# Patient Record
Sex: Male | Born: 1975 | Race: White | Hispanic: No | Marital: Married | Smoking: Current every day smoker
Health system: Southern US, Community
[De-identification: ages and names within clinical notes are randomized; demographics above are authoritative.]

## PROBLEM LIST (undated history)

## (undated) DIAGNOSIS — E781 Pure hyperglyceridemia: Secondary | ICD-10-CM

## (undated) DIAGNOSIS — K2970 Gastritis, unspecified, without bleeding: Secondary | ICD-10-CM

## (undated) HISTORY — DX: Pure hyperglyceridemia: E78.1

## (undated) HISTORY — DX: Gastritis, unspecified, without bleeding: K29.70

## (undated) HISTORY — PX: TONSILLECTOMY: SUR1361

---

## 2011-08-07 HISTORY — PX: OTHER SURGICAL HISTORY: SHX169

## 2012-07-07 ENCOUNTER — Encounter: Payer: Self-pay | Admitting: *Deleted

## 2012-07-07 ENCOUNTER — Emergency Department
Admission: EM | Admit: 2012-07-07 | Discharge: 2012-07-07 | Disposition: A | Discharge status: Home / Self Care | Payer: Managed Care, Other (non HMO) | Source: Home / Self Care | Attending: Family Medicine | Admitting: Family Medicine

## 2012-07-07 DIAGNOSIS — R52 Pain, unspecified: Secondary | ICD-10-CM

## 2012-07-07 DIAGNOSIS — J029 Acute pharyngitis, unspecified: Secondary | ICD-10-CM

## 2012-07-07 DIAGNOSIS — J111 Influenza due to unidentified influenza virus with other respiratory manifestations: Secondary | ICD-10-CM

## 2012-07-07 HISTORY — DX: Pure hyperglyceridemia: E78.1

## 2012-07-07 LAB — POCT RAPID STREP A (OFFICE): Rapid Strep A Screen: NEGATIVE

## 2012-07-07 LAB — POCT INFLUENZA A/B
Influenza A, POC: NEGATIVE
Influenza B, POC: NEGATIVE

## 2012-07-07 MED ORDER — BENZONATATE 200 MG PO CAPS: 200.0000 mg | ORAL_CAPSULE | Freq: Every day | ORAL | Status: DC

## 2012-07-07 MED ORDER — OSELTAMIVIR PHOSPHATE 75 MG PO CAPS: 75.0000 mg | ORAL_CAPSULE | Freq: Two times a day (BID) | ORAL | Status: DC

## 2012-07-07 NOTE — ED Provider Notes (Signed)
History     CSN: [id number]  Arrival date & time 07/07/12  1044   First MD Initiated Contact with Patient 07/07/12 1139      Chief Complaint  Patient presents with  . Sore Throat  . Generalized Body Aches      HPI Comments: Patient complains of onset of flu-like symptoms yesterday, with sore throat, cough, diffuse myalgias, headache, fever/chills/sweats.  Cough is now worse at night and generally non-productive during the day.  There has been no pleuritic pain, shortness of breath, or wheezes.   He has had a flu shot.  He has had pneumonia in the past.  The history is provided by the patient.    Past Medical History  Diagnosis Date  . High triglycerides     Past Surgical History  Procedure Date  . Tonsillectomy     Family History  Problem Relation Age of Onset  . Rheum arthritis Mother   . COPD Father     History  Substance Use Topics  . Smoking status: Current Every Day Smoker  . Smokeless tobacco: Not on file  . Alcohol Use: Yes      Review of Systems + sore throat + cough No pleuritic pain No wheezing + nasal congestion + post-nasal drainage No sinus pain/pressure No itchy/red eyes No earache No hemoptysis No SOB No fever, + chills + nausea No vomiting No abdominal pain No diarrhea No urinary symptoms No skin rashes + fatigue + myalgias + headache Used OTC meds without relief  Allergies  Lyrica; Morphine and related; and Tramadol  Home Medications   Current Outpatient Rx  Name  Route  Sig  Dispense  Refill  . BENZONATATE 200 MG PO CAPS   Oral   Take 1 capsule (200 mg total) by mouth at bedtime. Take as needed for cough   12 capsule   0   . OSELTAMIVIR PHOSPHATE 75 MG PO CAPS   Oral   Take 1 capsule (75 mg total) by mouth every 12 (twelve) hours.   10 capsule   0     BP 109/76  Pulse 77  Temp 98.1 F (36.7 C) (Oral)  Resp 18  Ht 5\' 9"  (1.753 m)  Wt 167 lb (75.751 kg)  BMI 24.66 kg/m2  SpO2 99%  Physical Exam Nursing  notes and Vital Signs reviewed. Appearance:  Patient appears healthy, stated age, and in no acute distress Eyes:  Pupils are equal, round, and reactive to light and accomodation.  Extraocular movement is intact.  Conjunctivae are not inflamed  Ears:  Canals normal.  Tympanic membranes normal.  Nose:  Mildly congested turbinates.  No sinus tenderness.    Pharynx:  Normal Neck:  Supple.  Slightly tender shotty posterior nodes are palpated bilaterally  Lungs:  Clear to auscultation.  Breath sounds are equal.  Chest:  Distinct tenderness to palpation over the mid-sternum.  Heart:  Regular rate and rhythm without murmurs, rubs, or gallops.  Abdomen:  Nontender without masses or hepatosplenomegaly.  Bowel sounds are present.  No CVA or flank tenderness.  Extremities:  No edema.  No calf tenderness Skin:  No rash present.   ED Course  Procedures none   Labs Reviewed  POCT RAPID STREP A (OFFICE) negative  POCT INFLUENZA A/B negative      1. Sore throat   2. Body aches   3. Influenza-like illness       MDM  Although flu test negative, patient's symptoms are definitely flu-like.  Will empirically  begin Tamiflu. Prescription written for Benzonatate [facility]) to take at bedtime for night-time cough.  Take Mucinex D (guaifenesin with decongestant) twice daily for congestion.  Increase fluid intake, rest. May use Afrin nasal spray (or generic oxymetazoline) twice daily for about 5 days.  Also recommend using saline nasal spray several times daily and saline nasal irrigation (AYR is a common brand) Stop all antihistamines for now, and other non-prescription cough/cold preparations. May take Ibuprofen 200mg , 4 tabs every 8 hours with food for chest/sternum discomfort, headache, body aches, etc. Follow-up with family doctor if not improving 7 to 10 days.         Lattie Haw, MD 07/08/12 [phone]

## 2012-07-07 NOTE — ED Notes (Addendum)
Patient c/o sore throat and body aches, chills, sweats x last night. No otc meds taken. Influenza vaccin on October 2013

## 2012-07-11 ENCOUNTER — Ambulatory Visit (INDEPENDENT_AMBULATORY_CARE_PROVIDER_SITE_OTHER): Payer: Managed Care, Other (non HMO) | Admitting: Family Medicine

## 2012-07-11 ENCOUNTER — Encounter: Payer: Self-pay | Admitting: Family Medicine

## 2012-07-11 VITALS — BP 121/85 | HR 89 | Temp 97.5°F | Ht 68.0 in | Wt 168.0 lb

## 2012-07-11 DIAGNOSIS — E781 Pure hyperglyceridemia: Secondary | ICD-10-CM | POA: Insufficient documentation

## 2012-07-11 DIAGNOSIS — J189 Pneumonia, unspecified organism: Secondary | ICD-10-CM

## 2012-07-11 HISTORY — DX: Pure hyperglyceridemia: E78.1

## 2012-07-11 MED ORDER — AZITHROMYCIN 250 MG PO TABS: ORAL_TABLET | ORAL | Status: AC

## 2012-07-11 MED ORDER — HYDROCODONE-HOMATROPINE 5-1.5 MG/5ML PO SYRP: 5.0000 mL | ORAL_SOLUTION | Freq: Three times a day (TID) | ORAL | Status: DC | PRN

## 2012-07-11 NOTE — Progress Notes (Signed)
CC: Jose Roth is a 36 y.o. male is here for Establish Care and flu sxs   Subjective: HPI:  Patient presents due to concern of illness. He was diagnosed with seasonal influenza or parainfluenza on Monday of this week, started Tamiflu 24 hours after symptoms began. He states that his sore throat is resolved however a mildly productive cough, diffuse muscle aches, fatigue, subjective fevers continue. Symptoms are present 24 hours a day, cough seems to interfere with sleep, interventions have included Mucinex and increase fluid intake. Chart review shows negative strep screen and influenza screen. Patient has no past medical history with respect to immune issues nor pulmonary disease. He is no longer smoking.  He denies confusion, motor or sensory disturbances, focal joint or muscle pain, darkened urine, blood and sputum, abdominal pain, nausea, vomiting, rashes. Denies shortness of breath or back pain  He has a history of hypertriglyceridemia, he's unsure when history please refer checked last however he is sure they were well over 500, he has been on medications for this in the past but insurance issues caused him to cease taking whatever medication he was on. He denies a history of pancreatitis, heart disease, exertional chest pain, shortness of breath, limb claudication, nor foot rest pain   Review Of Systems Outlined In HPI  Past Medical History  Diagnosis Date  . High triglycerides   . Hypertriglyceridemia 07/11/2012     Family History  Problem Relation Age of Onset  . Rheum arthritis Mother   . COPD Father      History  Substance Use Topics  . Smoking status: Current Every Day Smoker -- 0.5 packs/day for 16 years    Types: Cigarettes  . Smokeless tobacco: Not on file  . Alcohol Use: Yes     Objective: Filed Vitals:   07/11/12 0946  BP: 121/85  Pulse: 89  Temp: 97.5 F (36.4 C)    General: Alert and Oriented, No Acute Distress HEENT: Pupils equal, round, reactive to  light. Conjunctivae clear.  External ears unremarkable, canals clear with intact TMs with appropriate landmarks.  Middle ear appears open without effusion. Pink inferior turbinates.  Moist mucous membranes, pharynx without inflammation nor lesions.  Neck supple without palpable lymphadenopathy nor abnormal masses. Lungs: Comfortable work of breathing, good air movement, mild  rails in the left lower posterior lung field, no wheezing nor rhonchi  Cardiac: Regular rate and rhythm. Normal S1/S2.  No murmurs, rubs, nor gallops.   Extremities: No peripheral edema.  Strong peripheral pulses.  Mental Status: No depression, anxiety, nor agitation. Skin: Warm and dry.  Assessment & Plan: Jose Roth was seen today for establish care and flu sxs.  Diagnoses and associated orders for this visit:  Pneumonia - azithromycin (ZITHROMAX) 250 MG tablet; Take two tabs at once on day 1, then one tab daily on days 2-5. - HYDROcodone-homatropine (HYCODAN) 5-1.5 MG/5ML syrup; Take 5 mLs by mouth every 8 (eight) hours as needed for cough.  Hypertriglyceridemia - Lipid panel    Concerned that he may have walking pneumonia, I would like him to start azithromycin, and use Hycodan to help cut down his cough to help him sleep better.  I've asked him to start acetaminophen to help with the muscle aches, apparently he is intolerant of nonsteroidal anti-inflammatories. He was given a lab ticket for a lipid panel however he prefers to have this done in a few weeks once his sickness improves.Signs and symptoms requring emergent/urgent reevaluation were discussed with the patient.  Return in  about 4 weeks (around 08/08/2012).

## 2012-07-12 ENCOUNTER — Telehealth: Payer: Self-pay | Admitting: Emergency Medicine

## 2012-08-11 ENCOUNTER — Encounter (HOSPITAL_BASED_OUTPATIENT_CLINIC_OR_DEPARTMENT_OTHER): Payer: Self-pay

## 2012-08-11 ENCOUNTER — Ambulatory Visit (INDEPENDENT_AMBULATORY_CARE_PROVIDER_SITE_OTHER): Payer: Managed Care, Other (non HMO) | Admitting: Family Medicine

## 2012-08-11 ENCOUNTER — Encounter: Payer: Self-pay | Admitting: Family Medicine

## 2012-08-11 ENCOUNTER — Ambulatory Visit (HOSPITAL_BASED_OUTPATIENT_CLINIC_OR_DEPARTMENT_OTHER)
Admission: RE | Admit: 2012-08-11 | Discharge: 2012-08-11 | Disposition: A | Discharge status: Home / Self Care | Payer: Managed Care, Other (non HMO) | Source: Ambulatory Visit | Attending: Family Medicine | Admitting: Family Medicine

## 2012-08-11 VITALS — BP 136/86 | HR 112 | Temp 97.6°F | Wt 175.0 lb

## 2012-08-11 DIAGNOSIS — R110 Nausea: Secondary | ICD-10-CM

## 2012-08-11 DIAGNOSIS — R109 Unspecified abdominal pain: Secondary | ICD-10-CM

## 2012-08-11 DIAGNOSIS — K7689 Other specified diseases of liver: Secondary | ICD-10-CM | POA: Insufficient documentation

## 2012-08-11 DIAGNOSIS — N281 Cyst of kidney, acquired: Secondary | ICD-10-CM | POA: Insufficient documentation

## 2012-08-11 DIAGNOSIS — N410 Acute prostatitis: Secondary | ICD-10-CM

## 2012-08-11 DIAGNOSIS — R300 Dysuria: Secondary | ICD-10-CM

## 2012-08-11 DIAGNOSIS — R1031 Right lower quadrant pain: Secondary | ICD-10-CM | POA: Insufficient documentation

## 2012-08-11 LAB — POCT URINALYSIS DIPSTICK
Bilirubin, UA: NEGATIVE
Glucose, UA: NEGATIVE
Ketones, UA: NEGATIVE
Leukocytes, UA: NEGATIVE
Nitrite, UA: NEGATIVE

## 2012-08-11 MED ORDER — IOHEXOL 300 MG/ML  SOLN
100.0000 mL | Freq: Once | INTRAMUSCULAR | Status: AC | PRN
  Administered 2012-08-11: 100 mL via INTRAVENOUS

## 2012-08-11 MED ORDER — PROMETHAZINE HCL 50 MG PO TABS: 50.0000 mg | ORAL_TABLET | Freq: Four times a day (QID) | ORAL | Status: DC | PRN

## 2012-08-11 MED ORDER — HYDROCODONE-ACETAMINOPHEN 5-325 MG PO TABS: 1.0000 | ORAL_TABLET | ORAL | Status: DC | PRN

## 2012-08-11 MED ORDER — CIPROFLOXACIN HCL 500 MG PO TABS: 500.0000 mg | ORAL_TABLET | Freq: Two times a day (BID) | ORAL | Status: DC

## 2012-08-11 NOTE — Progress Notes (Signed)
CC: Jose Roth is a 37 y.o. male is here for Abdominal Pain, left side pain and Dysuria   Subjective: HPI:  Patient complains of one week of lower abdominal pain is described as a burning that radiates to his lower back and pelvis. Nothing makes this better or worse. He does not appear to be related to bowel movements which she reports are current 4 times a day and are somewhat loose. He is unsure whether or not this related to a urinary habits but does admit to urinary frequency and hesitancy which she has never had before and a sensation of discomfort at the base of the penis when urinating. This is been accompanied by nausea and vomiting which prompted a emergency room visit one week ago at [facility] where he received fluids and had a CT scan of the abdomen and blood work all of which per his report were normal. Symptoms were severe at the time of ED visit however have remained at moderate severity since. He reports no improvement over the past week. Nausea controlled with promethazine, nothing else seems to make the above symptoms better or worse. Symptoms are present 24 hours a day. Denies confusion, motor sensory disturbances, chest pain, shortness of breath, cough, flank pain, blood in stool, tarry or stool, discharge from penis nor testicular pain.  Review Of Systems Outlined In HPI  Past Medical History  Diagnosis Date  . High triglycerides   . Hypertriglyceridemia 07/11/2012     Family History  Problem Relation Age of Onset  . Rheum arthritis Mother   . COPD Father      History  Substance Use Topics  . Smoking status: Current Every Day Smoker -- 0.5 packs/day for 16 years    Types: Cigarettes  . Smokeless tobacco: Not on file  . Alcohol Use: Yes     Objective: Filed Vitals:   08/11/12 1549  BP: 136/86  Pulse: 112  Temp: 97.6 F (36.4 C)    General: Alert and Oriented, No Acute Distress HEENT: Pupils equal, round, reactive to light. Conjunctivae  clear.  External ears unremarkable, canals clear with intact TMs with appropriate landmarks.  Middle ear appears open without effusion. Pink inferior turbinates.  Moist mucous membranes, pharynx without inflammation nor lesions.  Neck supple without palpable lymphadenopathy nor abnormal masses. Lungs: Clear to auscultation bilaterally, no wheezing/ronchi/rales.  Comfortable work of breathing. Good air movement. Cardiac:  Mild tachycardia . Normal S1/S2.  No murmurs, rubs, nor gallops.   Abdomen:  Diminished bowel sounds, soft however right lower quadrant tenderness with rebound present. Left lower quadrant tenderness without rebound. Suprapubic tenderness with rebound. Psoas sign on the right is positive. Heel strike test reproduces pain Extremities: No peripheral edema.  Strong peripheral pulses.  Mental Status: No depression, anxiety, nor agitation. Skin: Warm and dry.  Assessment & Plan: Jose Roth was seen today for abdominal pain, left side pain and dysuria.  Diagnoses and associated orders for this visit:  Dysuria - Urinalysis Dipstick - Urine culture - GC/chlamydia probe amp, urine  Abdominal pain - CT Abdomen Pelvis W Contrast; Future - HYDROcodone-acetaminophen (NORCO) 5-325 MG per tablet; Take 1 tablet by mouth every 4 (four) hours as needed for pain.  Rlq abdominal pain - CT Abdomen Pelvis W Contrast; Future  Nausea - promethazine (PHENERGAN) 50 MG tablet; Take 1 tablet (50 mg total) by mouth every 6 (six) hours as needed for nausea.  Acute prostatitis - ciprofloxacin (CIPRO) 500 MG tablet; Take 1 tablet (500 mg  total) by mouth 2 (two) times daily.    Right lower quadrant pain: Concerning for appendicitis, urgent CT ordered as patient reluctant to go to emergency room.  Dysuria: Small blood in the urine we'll send for culture and GC/Chlamydia.  Patient notified at the following phone number  [phone] that CT scan showed no significant pathology to account for his  nausea, diarrhea, abdominal pain. Suspect acute bacterial prostatitis could be contributes to some of his symptoms therefore compared retreating with Cipro pending studies above. Asked him to contact me if not improving within 48 hours. Signs and symptoms requring emergent/urgent reevaluation were discussed with the patient.  No Follow-up on file.

## 2012-08-12 ENCOUNTER — Encounter: Payer: Self-pay | Admitting: *Deleted

## 2012-08-12 LAB — GC/CHLAMYDIA PROBE AMP, URINE: Chlamydia, Swab/Urine, PCR: NEGATIVE

## 2012-08-13 ENCOUNTER — Telehealth: Payer: Self-pay | Admitting: *Deleted

## 2012-08-13 LAB — URINE CULTURE: Organism ID, Bacteria: NO GROWTH

## 2012-08-13 NOTE — Telephone Encounter (Signed)
Jose Roth, Can you please ask Jose Roth to return tomorrow morning if still not feeling any better, it may take up to 48 hours for the cipro to fully start working.  If not better by tomorrow I'll need to reconsider a treatment plan. Thank you

## 2012-08-13 NOTE — Telephone Encounter (Signed)
Wife calls and states husband is not feeling any better. Still having abdominal pain. Hurts when urinates. Pain is not worse just the same. Temp is 99.6

## 2012-08-13 NOTE — Telephone Encounter (Signed)
Called and spoke to wife and she wanted to go ahead and put him on the schedule for tomorrow

## 2012-08-14 ENCOUNTER — Encounter: Payer: Self-pay | Admitting: Family Medicine

## 2012-08-14 ENCOUNTER — Ambulatory Visit (INDEPENDENT_AMBULATORY_CARE_PROVIDER_SITE_OTHER): Payer: Managed Care, Other (non HMO) | Admitting: Family Medicine

## 2012-08-14 VITALS — BP 115/90 | HR 89 | Temp 97.3°F | Wt 179.0 lb

## 2012-08-14 DIAGNOSIS — R197 Diarrhea, unspecified: Secondary | ICD-10-CM

## 2012-08-14 DIAGNOSIS — R319 Hematuria, unspecified: Secondary | ICD-10-CM

## 2012-08-14 DIAGNOSIS — R109 Unspecified abdominal pain: Secondary | ICD-10-CM

## 2012-08-14 LAB — CBC WITH DIFFERENTIAL/PLATELET
Basophils Absolute: 0 10*3/uL (ref 0.0–0.1)
Eosinophils Absolute: 0.3 10*3/uL (ref 0.0–0.7)
Eosinophils Relative: 3 % (ref 0–5)
MCH: 30.3 pg (ref 26.0–34.0)
MCV: 86 fL (ref 78.0–100.0)
Platelets: 224 10*3/uL (ref 150–400)
RDW: 14.4 % (ref 11.5–15.5)
WBC: 9.7 10*3/uL (ref 4.0–10.5)

## 2012-08-14 LAB — COMPLETE METABOLIC PANEL WITH GFR
AST: 65 U/L — ABNORMAL HIGH (ref 0–37)
Alkaline Phosphatase: 55 U/L (ref 39–117)
BUN: 10 mg/dL (ref 6–23)
Creat: 0.91 mg/dL (ref 0.50–1.35)

## 2012-08-14 LAB — POCT URINALYSIS DIPSTICK
Bilirubin, UA: NEGATIVE
Glucose, UA: NEGATIVE
Leukocytes, UA: NEGATIVE
Nitrite, UA: NEGATIVE
Urobilinogen, UA: 0.2
pH, UA: 6

## 2012-08-14 MED ORDER — HYOSCYAMINE SULFATE 0.125 MG PO TABS: 0.1250 mg | ORAL_TABLET | ORAL | Status: DC | PRN

## 2012-08-14 MED ORDER — METRONIDAZOLE 500 MG PO TABS: 500.0000 mg | ORAL_TABLET | Freq: Three times a day (TID) | ORAL | Status: AC

## 2012-08-14 NOTE — Progress Notes (Signed)
CC: Jose Roth is a 37 y.o. male is here for Abdominal Pain and Dysuria   Subjective: HPI:  Jose Roth presents for followup for abdominal pain, dysuria, loose stools. This is been going on for a little over a week now. Symptoms have been present on a daily basis, 24 hours a day, patient believes he may be feeling somewhat better from a abdominal pain standpoint but overall he still feels "poorly". Abdominal pain as described as a cramping in the lower abdomen which turns into a burning only when urinating. There is no pain on urination elsewhere. At his last visit the abdominal pain radiated to his back which is no longer present. He is still having 2-3 loose bowel movements a day. Cramping of the abdomen improves with curling forward, worse when lying flat. Overall symptoms are described as moderate to severe in intensity. Promethazine is helping with nausea however only temporarily. He denies vomiting since I saw him last, nor recent or remote are like or blood in the stool. He denies sick contacts. He believes he probably had the flu right before this all started. He denies confusion, shortness of breath, chest pain, cough, wheezing, back pain, gross hematuria, flank pain, fevers, chills, tachycardia , dizziness, muscle or joint pain   Review Of Systems Outlined In HPI  Past Medical History  Diagnosis Date  . High triglycerides   . Hypertriglyceridemia 07/11/2012     Family History  Problem Relation Age of Onset  . Rheum arthritis Mother   . COPD Father      History  Substance Use Topics  . Smoking status: Current Every Day Smoker -- 0.5 packs/day for 16 years    Types: Cigarettes  . Smokeless tobacco: Not on file  . Alcohol Use: Yes     Objective: Filed Vitals:   08/14/12 1000  BP: 115/90  Pulse:   Temp:     General: Alert and Oriented, No Acute Distress HEENT: Pupils equal, round, reactive to light. Conjunctivae clear.  External ears unremarkable, canals clear with  intact TMs with appropriate landmarks.  Middle ear appears open without effusion. Pink inferior turbinates.  Moist mucous membranes, pharynx without inflammation nor lesions.  Neck supple without palpable lymphadenopathy nor abnormal masses. Lungs: Clear to auscultation bilaterally, no wheezing/ronchi/rales.  Comfortable work of breathing. Good air movement. Cardiac: Regular rate and rhythm. Normal S1/S2.  No murmurs, rubs, nor gallops.   Abdomen: Hyperactive bowel sounds, no right upper quadrant pain, no epigastric pain, no palpable hepatosplenomegaly. There is suprapubic and left lower quadrant pain to deep palpation but no rebound. Heel strike mildly reproduces pain.  Extremities: No peripheral edema.  Strong peripheral pulses.  Mental Status: No depression, anxiety, nor agitation. Skin: Warm and dry.  Assessment & Plan: Chancy was seen today for abdominal pain and dysuria.  Diagnoses and associated orders for this visit:  Hematuria - Urinalysis, Routine w reflex microscopic - Urinalysis Dipstick  Abdominal pain - hyoscyamine (LEVSIN, ANASPAZ) 0.125 MG tablet; Take 1 tablet (0.125 mg total) by mouth every 4 (four) hours as needed for cramping. - metroNIDAZOLE (FLAGYL) 500 MG tablet; Take 1 tablet (500 mg total) by mouth 3 (three) times daily. - Lactic acid, plasma - CBC w/Diff - Lipase - COMPLETE METABOLIC PANEL WITH GFR  Diarrhea - CBC w/Diff - Clostridium difficile toxin - Stool Culture    Hematuria: Sending for microscopy to determine RBCs per high-power field .GC and chlamydia as well as urine culture were negative last visit, I like him to continue  Cipro for mild suspicion of prostatitis   Abdominal pain, Levsin to aid in discomfort from spasms. I like to start Flagyl until labs come back in case of infectious colitis. Lactic acid to rule out ischemic colitis. CBC primarily for white count. Lipase and liver enzymes to rule out upper abdominal pathology. Diarrhea: Stool  studies above to rule out infectious etiology Signs and symptoms requring emergent/urgent reevaluation were discussed with the patient. I will call him with labs begin to return  Return in about 4 days (around 08/18/2012).

## 2012-08-15 ENCOUNTER — Encounter: Payer: Self-pay | Admitting: Family Medicine

## 2012-08-15 ENCOUNTER — Telehealth: Payer: Self-pay | Admitting: *Deleted

## 2012-08-15 DIAGNOSIS — R748 Abnormal levels of other serum enzymes: Secondary | ICD-10-CM | POA: Insufficient documentation

## 2012-08-15 LAB — URINALYSIS, ROUTINE W REFLEX MICROSCOPIC
Glucose, UA: NEGATIVE mg/dL
Leukocytes, UA: NEGATIVE
Protein, ur: NEGATIVE mg/dL
pH: 6 (ref 5.0–8.0)

## 2012-08-15 LAB — LACTIC ACID, PLASMA: LACTIC ACID: 1 mmol/L (ref 0.5–2.2)

## 2012-08-15 LAB — LIPID PANEL: Triglycerides: 455 mg/dL — ABNORMAL HIGH (ref <150)

## 2012-08-15 NOTE — Telephone Encounter (Signed)
letter

## 2012-08-17 ENCOUNTER — Emergency Department (HOSPITAL_BASED_OUTPATIENT_CLINIC_OR_DEPARTMENT_OTHER)
Admission: EM | Admit: 2012-08-17 | Discharge: 2012-08-18 | Disposition: A | Discharge status: Home / Self Care | Payer: Managed Care, Other (non HMO) | Attending: Emergency Medicine | Admitting: Emergency Medicine

## 2012-08-17 ENCOUNTER — Encounter (HOSPITAL_BASED_OUTPATIENT_CLINIC_OR_DEPARTMENT_OTHER): Payer: Self-pay | Admitting: *Deleted

## 2012-08-17 DIAGNOSIS — F17200 Nicotine dependence, unspecified, uncomplicated: Secondary | ICD-10-CM | POA: Insufficient documentation

## 2012-08-17 DIAGNOSIS — R109 Unspecified abdominal pain: Secondary | ICD-10-CM | POA: Insufficient documentation

## 2012-08-17 DIAGNOSIS — Z8639 Personal history of other endocrine, nutritional and metabolic disease: Secondary | ICD-10-CM | POA: Insufficient documentation

## 2012-08-17 DIAGNOSIS — Z862 Personal history of diseases of the blood and blood-forming organs and certain disorders involving the immune mechanism: Secondary | ICD-10-CM | POA: Insufficient documentation

## 2012-08-17 DIAGNOSIS — Z79899 Other long term (current) drug therapy: Secondary | ICD-10-CM | POA: Insufficient documentation

## 2012-08-17 LAB — URINALYSIS, ROUTINE W REFLEX MICROSCOPIC
Bilirubin Urine: NEGATIVE
Glucose, UA: NEGATIVE mg/dL
Ketones, ur: NEGATIVE mg/dL
Leukocytes, UA: NEGATIVE
Nitrite: NEGATIVE
Protein, ur: NEGATIVE mg/dL
Specific Gravity, Urine: 1.006 (ref 1.005–1.030)
Urobilinogen, UA: 0.2 mg/dL (ref 0.0–1.0)
pH: 6.5 (ref 5.0–8.0)

## 2012-08-17 LAB — CBC WITH DIFFERENTIAL/PLATELET
Basophils Absolute: 0 10*3/uL (ref 0.0–0.1)
Basophils Relative: 0 % (ref 0–1)
Eosinophils Absolute: 0.4 10*3/uL (ref 0.0–0.7)
HCT: 45.1 % (ref 39.0–52.0)
Lymphs Abs: 3.4 10*3/uL (ref 0.7–4.0)
MCH: 30.3 pg (ref 26.0–34.0)
MCHC: 34.8 g/dL (ref 30.0–36.0)
MCV: 86.9 fL (ref 78.0–100.0)
Monocytes Absolute: 1.2 10*3/uL — ABNORMAL HIGH (ref 0.1–1.0)
Neutro Abs: 8.2 10*3/uL — ABNORMAL HIGH (ref 1.7–7.7)
RDW: 13.4 % (ref 11.5–15.5)

## 2012-08-17 LAB — COMPREHENSIVE METABOLIC PANEL
Albumin: 4.1 g/dL (ref 3.5–5.2)
BUN: 11 mg/dL (ref 6–23)
Creatinine, Ser: 1.1 mg/dL (ref 0.50–1.35)
GFR calc Af Amer: >90 mL/min (ref >90)
Glucose, Bld: 97 mg/dL (ref 70–99)
Total Protein: 7.4 g/dL (ref 6.0–8.3)

## 2012-08-17 LAB — URINE MICROSCOPIC-ADD ON

## 2012-08-17 LAB — LIPASE, BLOOD: Lipase: 30 U/L (ref 11–59)

## 2012-08-17 MED ORDER — SODIUM CHLORIDE 0.9 % IV SOLN
Freq: Once | INTRAVENOUS | Status: AC
  Administered 2012-08-17: 23:00:00 via INTRAVENOUS

## 2012-08-17 MED ORDER — HYDROMORPHONE HCL PF 1 MG/ML IJ SOLN
1.0000 mg | Freq: Once | INTRAMUSCULAR | Status: AC
  Administered 2012-08-17: 1 mg via INTRAVENOUS
  Filled 2012-08-17: qty 1

## 2012-08-17 MED ORDER — ONDANSETRON HCL 4 MG/2ML IJ SOLN
4.0000 mg | Freq: Once | INTRAMUSCULAR | Status: AC
  Administered 2012-08-17: 4 mg via INTRAVENOUS
  Filled 2012-08-17: qty 2

## 2012-08-17 NOTE — ED Notes (Addendum)
C/o abd pain since Jan 1st. Describes as constant and squeezing type pain. Also describes as a burning type pain. States pain is mid abdominal.  Has been seen by his MD recently and had a negative CT scan done. Has been treated with antibiotics. C/o nausea. Denies vomiting and diarrhea. Has been treated for "bladder infection" as well

## 2012-08-17 NOTE — ED Provider Notes (Signed)
History     CSN: [id number]  Arrival date & time 08/17/12  1936   First MD Initiated Contact with Patient 08/17/12 2158      Chief Complaint  Patient presents with  . Abdominal Pain    (Consider location/radiation/quality/duration/timing/severity/associated sxs/prior treatment) Patient is a 37 y.o. male presenting with abdominal pain. The history is provided by the patient. No language interpreter was used.  Abdominal Pain The primary symptoms of the illness include abdominal pain. Episode onset: 1 week. The onset of the illness was gradual. The problem has been gradually worsening.  Associated with: pt had ct scan, labs and ua.   Pt is on antibiotics for infection. The patient has not had a change in bowel habit. Risk factors: none. Significant associated medical issues do not include PUD or inflammatory bowel disease.    Past Medical History  Diagnosis Date  . High triglycerides   . Hypertriglyceridemia 07/11/2012    Past Surgical History  Procedure Date  . Tonsillectomy     Family History  Problem Relation Age of Onset  . Rheum arthritis Mother   . COPD Father     History  Substance Use Topics  . Smoking status: Current Every Day Smoker -- 0.5 packs/day for 16 years    Types: Cigarettes  . Smokeless tobacco: Not on file  . Alcohol Use: Yes     Comment: social       Review of Systems  Gastrointestinal: Positive for abdominal pain.  All other systems reviewed and are negative.    Allergies  Lyrica; Morphine and related; and Tramadol  Home Medications   Current Outpatient Rx  Name  Route  Sig  Dispense  Refill  . CIPROFLOXACIN HCL 500 MG PO TABS   Oral   Take 1 tablet (500 mg total) by mouth 2 (two) times daily.   60 tablet   0   . HYDROCODONE-ACETAMINOPHEN 5-325 MG PO TABS   Oral   Take 1 tablet by mouth every 4 (four) hours as needed for pain.   20 tablet   0   . HYOSCYAMINE SULFATE 0.125 MG PO TABS   Oral   Take 1 tablet (0.125 mg total)  by mouth every 4 (four) hours as needed for cramping.   30 tablet   0   . METRONIDAZOLE 500 MG PO TABS   Oral   Take 1 tablet (500 mg total) by mouth 3 (three) times daily.   21 tablet   0   . PROMETHAZINE HCL 50 MG PO TABS   Oral   Take 1 tablet (50 mg total) by mouth every 6 (six) hours as needed for nausea.   30 tablet   0     BP 115/83  Pulse 110  Temp 98.9 F (37.2 C) (Oral)  Resp 12  Ht 5\' 9"  (1.753 m)  Wt 179 lb (81.194 kg)  BMI 26.43 kg/m2  SpO2 98%  Physical Exam  Constitutional: He appears well-developed and well-nourished.  HENT:  Head: Normocephalic and atraumatic.  Eyes: Pupils are equal, round, and reactive to light.  Neck: Normal range of motion.  Cardiovascular: Normal rate and normal heart sounds.   Pulmonary/Chest: Effort normal.  Abdominal: Soft. There is tenderness.       Tender right upper quadrant and right mid abdomen  Genitourinary: Penis normal.       Testicle nontender, no hernia  Neurological: He is alert.  Skin: Skin is warm.    ED Course  Procedures (including critical  care time)  Labs Reviewed  URINALYSIS, ROUTINE W REFLEX MICROSCOPIC - Abnormal; Notable for the following:    Hgb urine dipstick TRACE (*)     All other components within normal limits  URINE MICROSCOPIC-ADD ON   No results found.   1. Abdominal pain       MDM  Percocet  And prilosec.   See your Physician for recheck tomorrow.  Schedule to see Gi for evaluation.   Gallbladder ultrasound scheduled   Pt given Iv pain medications with some relief.        Lonia Skinner [place], [place] 08/18/12 [phone]

## 2012-08-18 ENCOUNTER — Encounter: Payer: Self-pay | Admitting: Family Medicine

## 2012-08-18 ENCOUNTER — Ambulatory Visit (INDEPENDENT_AMBULATORY_CARE_PROVIDER_SITE_OTHER): Payer: Managed Care, Other (non HMO) | Admitting: Family Medicine

## 2012-08-18 ENCOUNTER — Ambulatory Visit (HOSPITAL_BASED_OUTPATIENT_CLINIC_OR_DEPARTMENT_OTHER)
Admit: 2012-08-18 | Discharge: 2012-08-18 | Disposition: A | Discharge status: Home / Self Care | Payer: Managed Care, Other (non HMO) | Attending: Emergency Medicine | Admitting: Emergency Medicine

## 2012-08-18 ENCOUNTER — Other Ambulatory Visit (HOSPITAL_COMMUNITY)
Admission: RE | Admit: 2012-08-18 | Discharge: 2012-08-18 | Disposition: A | Discharge status: Home / Self Care | Payer: Managed Care, Other (non HMO) | Source: Ambulatory Visit | Attending: Family Medicine | Admitting: Family Medicine

## 2012-08-18 VITALS — BP 111/74 | HR 88 | Temp 97.5°F | Wt 173.0 lb

## 2012-08-18 DIAGNOSIS — R109 Unspecified abdominal pain: Secondary | ICD-10-CM | POA: Insufficient documentation

## 2012-08-18 DIAGNOSIS — R3129 Other microscopic hematuria: Secondary | ICD-10-CM | POA: Insufficient documentation

## 2012-08-18 DIAGNOSIS — R319 Hematuria, unspecified: Secondary | ICD-10-CM | POA: Insufficient documentation

## 2012-08-18 DIAGNOSIS — R112 Nausea with vomiting, unspecified: Secondary | ICD-10-CM | POA: Insufficient documentation

## 2012-08-18 DIAGNOSIS — R748 Abnormal levels of other serum enzymes: Secondary | ICD-10-CM

## 2012-08-18 MED ORDER — OXYCODONE-ACETAMINOPHEN 5-325 MG PO TABS: 2.0000 | ORAL_TABLET | ORAL | Status: DC | PRN

## 2012-08-18 MED ORDER — OMEPRAZOLE 20 MG PO CPDR: 20.0000 mg | DELAYED_RELEASE_CAPSULE | Freq: Every day | ORAL | Status: DC

## 2012-08-18 MED ORDER — METOCLOPRAMIDE HCL 10 MG PO TABS: 10.0000 mg | ORAL_TABLET | Freq: Three times a day (TID) | ORAL | Status: DC | PRN

## 2012-08-18 MED ORDER — HYDROMORPHONE HCL PF 1 MG/ML IJ SOLN
1.0000 mg | Freq: Once | INTRAMUSCULAR | Status: AC
  Administered 2012-08-18: 1 mg via INTRAVENOUS
  Filled 2012-08-18: qty 1

## 2012-08-18 NOTE — Progress Notes (Signed)
CC: Jose Roth is a 37 y.o. male is here for ER f/u   Subjective: HPI:  Followup of abdominal pain: Patient reports continued moderate abdominal pain over the weekend which became severe late last night without any precipitating event. Pain improved while at the emergency room after receiving hydromorphone. Pain is described as a burning and twisting in the right side of the abdomen hard to localize. Nothing particularly makes it worse. He's been sticking mostly to a liquid diet with moderate nausea but no vomiting. He had a daily bowel movement over the weekend that was loose but not liquid form, without tar appearance and without blood. Pain does not change prior, during, nor after bowel movements. Pain is present 24 hours a day, often wakes him from sleep. He had an ultrasound done today which did not show any abnormalities, specifically no evidence of gallstones. He's had mildly elevated liver enzymes on his last 2 blood draws, lipase was normal but he did have a white count of 13 in the ED last night. He's continued on Flagyl and Cipro. He has yet to drop off stool sample but plans to do that today. He's had some chills last night. Denies fevers, skin or scleral discoloration, myalgia, blood in urine, penile discharge, testicular pain, back pain, flank pain, unintentional weight loss, rashes. States that nausea is present 24 hours a day and promethazine is not helping much.  Urine dipstick was suggestive of blood at a prior visit, sample sent to the lab did not reflect this and subsequently did not have microscopic evaluation. A UA with micro-showed microscopic hematuria last night in the setting of a recent negative urine culture and negative gonorrhea and Chlamydia evaluation. Patient continues to have suprapubic hard to define discomfort when urinating.   Review Of Systems Outlined In HPI  Past Medical History  Diagnosis Date  . High triglycerides   . Hypertriglyceridemia 07/11/2012      Family History  Problem Relation Age of Onset  . Rheum arthritis Mother   . COPD Father      History  Substance Use Topics  . Smoking status: Current Every Day Smoker -- 0.5 packs/day for 16 years    Types: Cigarettes  . Smokeless tobacco: Not on file  . Alcohol Use: Yes     Comment: social      Objective: Filed Vitals:   08/18/12 1106  BP: 111/74  Pulse: 88  Temp: 97.5 F (36.4 C)    General: Alert and Oriented, in mild discomfort HEENT: Pupils equal, round, reactive to light. Conjunctivae clear.  Moist mucous membranes, pharynx without inflammation nor lesions.  Neck supple without palpable lymphadenopathy nor abnormal masses. Lungs: Clear and comfortable work of breathing i/rales.  Comfortable work of breathing. Good air movement. Cardiac:  regular rate and rhythm Abdomen: Normal bowel sounds, soft with mild right upper quadrant tenderness and right lower quadrant tenderness with no rebound Extremities: No peripheral edema.  Strong peripheral pulses.  Mental Status: No depression, anxiety, nor agitation. Skin: Warm and dry.  Assessment & Plan: Jose Roth was seen today for er f/u.  Diagnoses and associated orders for this visit:  Elevated liver enzymes (jan 2014) - Hepatitis, Acute  Microscopic hematuria - Cytology - non gyn  Abdominal pain - Ambulatory referral to Gastroenterology - Hepatitis, Acute  Other Orders - metoCLOPramide (REGLAN) 10 MG tablet; Take 1 tablet (10 mg total) by mouth 3 (three) times daily as needed. For nausea    Abdominal pain: Uncontrolled, hepatitis panel to further  investigate elevated LFTs keeping in mind patient's recent triglyceride level of 455.  Low suspicion for obstruction as he is taking in oral fluids without vomiting and having a daily bowel movement. GI referral has been made with an appointment tomorrow. Start Reglan for nausea, call if not helping, continue Cipro and Flagyl pending stool study results. Microscopic  hematuria: Urine cytology will be sent keeping in mind renal cysts on recent imaging noted. Signs and symptoms requring emergent/urgent reevaluation were discussed with the patient. Return if symptoms worsen or fail to improve.

## 2012-08-18 NOTE — ED Notes (Signed)
Jose Roth in to talk with pt.

## 2012-08-18 NOTE — ED Provider Notes (Signed)
Medical screening examination/treatment/procedure(s) were performed by non-physician practitioner and as supervising physician I was immediately available for consultation/collaboration.   Gavin Pound. Janequa Kipnis, MD 08/18/12 [id number]

## 2012-08-19 ENCOUNTER — Ambulatory Visit (INDEPENDENT_AMBULATORY_CARE_PROVIDER_SITE_OTHER): Payer: Managed Care, Other (non HMO) | Admitting: Internal Medicine

## 2012-08-19 ENCOUNTER — Encounter: Payer: Self-pay | Admitting: Internal Medicine

## 2012-08-19 VITALS — BP 106/70 | HR 76 | Ht 69.0 in | Wt 177.4 lb

## 2012-08-19 DIAGNOSIS — R197 Diarrhea, unspecified: Secondary | ICD-10-CM

## 2012-08-19 DIAGNOSIS — R1030 Lower abdominal pain, unspecified: Secondary | ICD-10-CM

## 2012-08-19 DIAGNOSIS — R109 Unspecified abdominal pain: Secondary | ICD-10-CM

## 2012-08-19 DIAGNOSIS — R110 Nausea: Secondary | ICD-10-CM

## 2012-08-19 LAB — HEPATITIS PANEL, ACUTE: HCV Ab: NEGATIVE

## 2012-08-19 MED ORDER — PROBIOTIC FORMULA PO CAPS: 1.0000 | ORAL_CAPSULE | Freq: Every day | ORAL | Status: DC

## 2012-08-19 MED ORDER — OXYCODONE-ACETAMINOPHEN 5-325 MG PO TABS: 2.0000 | ORAL_TABLET | ORAL | Status: DC | PRN

## 2012-08-19 NOTE — Progress Notes (Signed)
Patient ID: Jose Roth, male   DOB: 06/23/1976, 36 y.o.   MRN: [id number]  SUBJECTIVE: HPI Jose Roth is a 36 yo male seen auscultation of the request of Dr. Hommel for evaluation of abdominal pain, nausea vomiting and diarrhea.  The patient reports his symptoms started abruptly 2 weeks ago initially a severe nausea and vomiting with diarrhea. His diarrhea was initially 8-10 times per day, but nonbloody. He also denies melena. He has been evaluated both by primary care and in the emergency department throughout this illness. His vomiting has resolved but he still is having issues with nausea on a daily basis. His diarrhea has also waned to some degree, and he is only having approximately 2 stools per day. His stools remain loose, not formed, and nonbloody.  He is continue to have mid abdominal pain that also radiates towards the right. He's noted some low-grade fevers less than 100F as well as chills. He does report he does have an appetite and is able to eat.  He had spaghetti for dinner last night. He also notes some abdominal bloating. He remains on ciprofloxacin and metronidazole. He is using metoclopramide for nausea. He was also started on omeprazole. He was on no medications prior to becoming ill. He is also using oxycodone which helps with the pain. No sick contacts. His wife has had no GI complaints.  Review of Systems  As per history of present illness, otherwise negative   Past Medical History  Diagnosis Date  . High triglycerides   . Hypertriglyceridemia 07/11/2012    Current Outpatient Prescriptions  Medication Sig Dispense Refill  . ciprofloxacin (CIPRO) 500 MG tablet Take 1 tablet (500 mg total) by mouth 2 (two) times daily.  60 tablet  0  . hyoscyamine (LEVSIN, ANASPAZ) 0.125 MG tablet Take 1 tablet (0.125 mg total) by mouth every 4 (four) hours as needed for cramping.  30 tablet  0  . metoCLOPramide (REGLAN) 10 MG tablet Take 1 tablet (10 mg total) by mouth 3 (three) times daily  as needed. For nausea  60 tablet  0  . metroNIDAZOLE (FLAGYL) 500 MG tablet Take 1 tablet (500 mg total) by mouth 3 (three) times daily.  21 tablet  0  . omeprazole (PRILOSEC) 20 MG capsule Take 1 capsule (20 mg total) by mouth daily.  20 capsule  0  . oxyCODONE-acetaminophen (PERCOCET/ROXICET) 5-325 MG per tablet Take 2 tablets by mouth every 4 (four) hours as needed for pain.  30 tablet  0  . Probiotic Product (PROBIOTIC FORMULA) CAPS Take 1 capsule by mouth daily.  30 capsule  0    Allergies  Allergen Reactions  . Lyrica (Pregabalin)   . Morphine And Related   . Tramadol     Family History  Problem Relation Age of Onset  . Rheum arthritis Mother   . COPD Father   . Colon cancer Neg Hx   . Colon polyps Father     History  Substance Use Topics  . Smoking status: Current Every Day Smoker -- 0.5 packs/day for 16 years    Types: Cigarettes  . Smokeless tobacco: Not on file     Comment: form given 08-19-12.  . Alcohol Use: Yes     Comment: social     OBJECTIVE: BP 106/70  Pulse 76  Ht 5' 9" (1.753 m)  Wt 177 lb 6.4 oz (80.468 kg)  BMI 26.20 kg/m2 Constitutional: Well-developed and well-nourished. No distress. HEENT: Normocephalic and atraumatic. Oropharynx is clear and moist. No   oropharyngeal exudate. Conjunctivae are normal. No scleral icterus. Edentulous Neck: Neck supple. Trachea midline. Cardiovascular: Normal rate, regular rhythm and intact distal pulses. No M/R/G Pulmonary/chest: Effort normal and breath sounds normal. No wheezing, rales or rhonchi. Abdominal: Soft, moderate mid and lower abdominal tenderness without rebound or guarding, nondistended. Bowel sounds active throughout. There are no masses palpable. No hepatosplenomegaly. Extremities: no clubbing, cyanosis, or edema Lymphadenopathy: No cervical adenopathy noted. Neurological: Alert and oriented to person place and time. Skin: Skin is warm and dry. No rashes noted. Psychiatric: Normal mood and affect.  Behavior is normal.  Labs and Imaging -- CBC    Component Value Date/Time   WBC 13.2* 08/17/2012 2252   RBC 5.19 08/17/2012 2252   HGB 15.7 08/17/2012 2252   HCT 45.1 08/17/2012 2252   PLT 216 08/17/2012 2252   MCV 86.9 08/17/2012 2252   MCH 30.3 08/17/2012 2252   MCHC 34.8 08/17/2012 2252   RDW 13.4 08/17/2012 2252   LYMPHSABS 3.4 08/17/2012 2252   MONOABS 1.2* 08/17/2012 2252   EOSABS 0.4 08/17/2012 2252   BASOSABS 0.0 08/17/2012 2252    CMP     Component Value Date/Time   NA 138 08/17/2012 2252   K 3.8 08/17/2012 2252   CL 101 08/17/2012 2252   CO2 25 08/17/2012 2252   GLUCOSE 97 08/17/2012 2252   BUN 11 08/17/2012 2252   CREATININE 1.10 08/17/2012 2252   CREATININE 0.91 08/14/2012 1008   CALCIUM 9.2 08/17/2012 2252   PROT 7.4 08/17/2012 2252   ALBUMIN 4.1 08/17/2012 2252   AST 43* 08/17/2012 2252   ALT 96* 08/17/2012 2252   ALKPHOS 54 08/17/2012 2252   BILITOT 0.2* 08/17/2012 2252   GFRNONAA 86* 08/17/2012 2252   GFRAA >90 08/17/2012 2252    Lipase     Component Value Date/Time   LIPASE 30 08/17/2012 2252  COMPLETE ABDOMINAL ULTRASOUND   Comparison:  CT abdomen pelvis 08/11/2012.   Findings:   Gallbladder:  No gallstones, gallbladder wall thickening, or pericholecystic fluid.   Common bile duct:  Visualization is somewhat limited by bowel gas. Measures up to approximately 3 mm, within normal limits.   Liver:  No focal lesion identified.  Within normal limits in parenchymal echogenicity.   IVC:  Obscured by bowel gas.   Pancreas:  Obscured by bowel gas.   Spleen:  Measures 9.3 cm, negative.  Small splenule is seen in the hilum.   Right Kidney:  Measures 9.7 cm with a 1.0 cm nearly anechoic lesion off the lower pole, most consistent with a cyst.  No hydronephrosis.  No solid lesions.   Left Kidney:  Measures 9.1 cm.  Parenchymal echogenicity is normal. No hydronephrosis.  No focal lesions.   Abdominal aorta:  Visualization is somewhat limited by bowel gas. Measures up  to approximately 2.2 cm.   IMPRESSION:   1.  Examination is somewhat limited by bowel gas. 2.  No acute findings.   CT ABDOMEN AND PELVIS WITH CONTRAST   Technique:  Multidetector CT imaging of the abdomen and pelvis was performed following the standard protocol during bolus administration of intravenous contrast.   Contrast: 100mL OMNIPAQUE IOHEXOL 300 MG/ML  SOLN   Comparison: None.   Findings: Radiopaque material within the appendix may reflect ingest material versus small appendicoliths.  No evidence of inflammation of the appendix.   Status small number of diverticula predominate left colon without extraluminal bowel inflammatory process, free fluid or free air.   Mild fatty infiltration liver without focal   mass.   Contracted gallbladder without calcified gallstones.  Spleen is at the upper limits of normal in size spanning over 12.8 cm length without focal mass.   No focal pancreatic lesion or adrenal lesion.   1 cm inferior pole right renal cyst.  Tiny low density structures within both kidneys too small to adequately characterize statistically likely cysts.  Tiny right angio myolipoma not entirely excluded.   Lung bases clear.  No abdominal aortic aneurysm.   Top normal sized left external iliac lymph node without adenopathy noted.   No bony destructive lesion.   IMPRESSION: No extraluminal bowel inflammatory process, free fluid or free air. Specifically, no evidence to suggest appendicitis.  Appendicoliths may be present. Scattered small number of colonic diverticula.   Spleen top normal in size.   Mild fatty infiltration liver.   Right renal cyst.  Tiny low density lesions in both kidneys too small to adequately characterize statistically likely small cysts.   ASSESSMENT AND PLAN:  36 yo male seen auscultation of the request of Dr. Hommel for evaluation of abdominal pain, nausea vomiting and diarrhea.    1.  Mid abdominal pain/nausea,  vomiting/diarrhea -- his symptoms are most consistent with an acute infectious gastrointestinal illness.  Things have improved somewhat specifically the vomiting and loose stools/diarrhea. He is tender on exam today but nontoxic without concern for obstruction.  I recommend stool studies with a GI pathogen panel which will help evaluate for viral, bacterial and parasitic infection.  Also feel this point he should continue and complete his course of ciprofloxacin and metronidazole. I like for him to start a probiotic and he is given samples. He'll return in 2 weeks, but he is asked to contact us if his symptoms worsen. If they do worsen or fail to continue to improve, I would likely repeat the CT scan of the abdomen and pelvis.  Finally, I will give him a prescription for Percocet 5/325 #30 to help with his pain. He is advised this will not be an ongoing or chronic prescription.  2.  Mild transaminitis -- this may be secondary to fatty liver disease, but also could be reactive around this intestinal illness.  We will follow this going forward, and can discuss it further once he improves from his acute illness       

## 2012-08-19 NOTE — Patient Instructions (Addendum)
Your physician has requested that you go to the basement for  lab work before leaving today.  You have been given samples of [facility] colon health probiotic, take 1 capsule daily.  Continue taking your current dose of antibiotics  We have sent the following medications to your pharmacy for you to pick up at your convenience: Percocet  Follow up with Dr. Rhea Belton in office in 2 weeks

## 2012-08-20 ENCOUNTER — Encounter: Payer: Self-pay | Admitting: Family Medicine

## 2012-08-21 ENCOUNTER — Other Ambulatory Visit: Payer: Managed Care, Other (non HMO)

## 2012-08-21 ENCOUNTER — Telehealth: Payer: Self-pay | Admitting: Internal Medicine

## 2012-08-21 DIAGNOSIS — R1030 Lower abdominal pain, unspecified: Secondary | ICD-10-CM

## 2012-08-21 DIAGNOSIS — R112 Nausea with vomiting, unspecified: Secondary | ICD-10-CM

## 2012-08-21 DIAGNOSIS — R109 Unspecified abdominal pain: Secondary | ICD-10-CM

## 2012-08-21 DIAGNOSIS — R197 Diarrhea, unspecified: Secondary | ICD-10-CM

## 2012-08-21 DIAGNOSIS — R110 Nausea: Secondary | ICD-10-CM

## 2012-08-21 NOTE — Telephone Encounter (Signed)
lmom for pt to call back. Scheduled pt for CT in am.

## 2012-08-21 NOTE — Telephone Encounter (Signed)
He needs to submit the stool studies Ok for repeat CT abd and pelvis with contrast - abd pain, loose stools, n/v

## 2012-08-21 NOTE — Telephone Encounter (Signed)
Informed pt's wife of CT appt with instructions. Asked her to come pick up the contrast so it can be refrigerated and also, bring in stool samples. Wife stated understanding.

## 2012-08-21 NOTE — Telephone Encounter (Signed)
Pt seen 08/19/12 for abd. Pain, N/V. He is on Reglan for nausea. Wife reports they were to call back for a condition update. She reports pt started vomiting again last night and he remains nauseated this am and he c/o his stomach burning. I asked if he has an anti emetic and she stated he has Phenergan, but it wasn't working so Reglan was ordered. In your note you mentioned repeating the CT if s&s recurred; please advise. Thanks.

## 2012-08-22 ENCOUNTER — Ambulatory Visit (INDEPENDENT_AMBULATORY_CARE_PROVIDER_SITE_OTHER)
Admission: RE | Admit: 2012-08-22 | Discharge: 2012-08-22 | Disposition: A | Discharge status: Home / Self Care | Payer: Managed Care, Other (non HMO) | Source: Ambulatory Visit | Attending: Internal Medicine | Admitting: Internal Medicine

## 2012-08-22 DIAGNOSIS — R112 Nausea with vomiting, unspecified: Secondary | ICD-10-CM

## 2012-08-22 DIAGNOSIS — R109 Unspecified abdominal pain: Secondary | ICD-10-CM

## 2012-08-22 LAB — GASTROINTESTINAL PATHOGEN PANEL PCR
Cryptosporidium, PCR: NEGATIVE
E coli (ETEC) LT/ST PCR: NEGATIVE
E coli 0157, PCR: NEGATIVE
Salmonella, PCR: NEGATIVE
Shigella, PCR: NEGATIVE

## 2012-08-22 MED ORDER — IOHEXOL 300 MG/ML  SOLN
100.0000 mL | Freq: Once | INTRAMUSCULAR | Status: AC | PRN
  Administered 2012-08-22: 100 mL via INTRAVENOUS

## 2012-08-25 ENCOUNTER — Telehealth: Payer: Self-pay | Admitting: *Deleted

## 2012-08-25 ENCOUNTER — Telehealth: Payer: Self-pay | Admitting: Internal Medicine

## 2012-08-25 DIAGNOSIS — R110 Nausea: Secondary | ICD-10-CM

## 2012-08-25 MED ORDER — PROCHLORPERAZINE MALEATE 5 MG PO TABS: ORAL_TABLET | ORAL | Status: DC

## 2012-08-25 NOTE — Telephone Encounter (Addendum)
Dr Rhea Belton, pt wants to know if you will give him something else for pain? Pt received 30 Percocet on 08/19/12, 2 tabs q4hr. Thanks,

## 2012-08-25 NOTE — Telephone Encounter (Signed)
Discussed refill order with Dr Rhea Belton and then spoke with pt's wife and informed her we don't routinely order narcotics for pts and offered Tramadol, which pt is allergic to per chart. Wife was very nice and stated understanding.

## 2012-08-25 NOTE — Telephone Encounter (Signed)
Ok to refill pain RX once.  No further refills without being seen

## 2012-08-25 NOTE — Telephone Encounter (Signed)
Pt calls and states that the Reglan is not helping the nausea anymore. Was on phenergan but was taken off of it and put on the reglan. Wants to know if there is anything else he can do for the nausea

## 2012-08-25 NOTE — Telephone Encounter (Signed)
Pt's wife states pt had a rough weekend. She reports he's not feeling well, his stomach is hurting, he's nauseated and he has no appetite.  When asked about a fever and constipation, she states she " don't think so". Per wife, he would like a note to be out a little longer. Please advise. Thanks.

## 2012-08-25 NOTE — Telephone Encounter (Signed)
Informed wife that we will write another work excuse for pt to return to work on 08/28/12. He is to call for fever, chills, or worsening of his condition. Dr Rhea Belton is on hospital duty next week, so he will come on 09/09/12. If pt's condition worsens and he needs to be seen before then, he is to call to see a mid level. Wife stated understanding.

## 2012-08-25 NOTE — Telephone Encounter (Signed)
Okay for out of work another 3 days.   OV next week to reassess, call if fever, chills, worsening

## 2012-08-25 NOTE — Telephone Encounter (Signed)
Jose Roth, Will you please let Jose Roth know that i sent in a stronger nausea medicine only to be used if zofran isn't helping.  If he develops any muscle spasms while on this medication he should seek medical care immediately.  Thank you.

## 2012-08-25 NOTE — Telephone Encounter (Signed)
Notes Recorded by Beverley Fiedler, MD on 08/25/2012 at 8:56 AM Neg PCR pain. Complete ABX, continue or start OTC probiotic

## 2012-08-26 NOTE — Telephone Encounter (Signed)
Pt notified and voiced understanding 

## 2012-08-28 ENCOUNTER — Telehealth: Payer: Self-pay | Admitting: Internal Medicine

## 2012-08-28 NOTE — Telephone Encounter (Signed)
Wife stated pt's pain has "flared up" again. His stomach is hurting and he has diarrhea again. I asked when the diarrhea stopped, because that's what we originally saw his for along with the pain. Wife stated diarrhea had gotten better, but it flared up again. Offered pt an OV today with a mid level and wife stated she had to pick up someone and could he come tomorrow. appt given with Doug Sou, PA in am. Wife wants to know if they can skip the Co Pay of $25? Spoke with the front staff and pt can have a once a year exemption for co pay; wife states they will use it tomorrow.

## 2012-08-29 ENCOUNTER — Encounter: Payer: Self-pay | Admitting: Internal Medicine

## 2012-08-29 ENCOUNTER — Ambulatory Visit (INDEPENDENT_AMBULATORY_CARE_PROVIDER_SITE_OTHER): Payer: Managed Care, Other (non HMO) | Admitting: Gastroenterology

## 2012-08-29 ENCOUNTER — Encounter: Payer: Self-pay | Admitting: Gastroenterology

## 2012-08-29 VITALS — BP 100/78 | HR 80 | Ht 69.0 in | Wt 174.4 lb

## 2012-08-29 DIAGNOSIS — R109 Unspecified abdominal pain: Secondary | ICD-10-CM

## 2012-08-29 DIAGNOSIS — R112 Nausea with vomiting, unspecified: Secondary | ICD-10-CM

## 2012-08-29 DIAGNOSIS — R197 Diarrhea, unspecified: Secondary | ICD-10-CM | POA: Insufficient documentation

## 2012-08-29 MED ORDER — MOVIPREP 100 G PO SOLR: 1.0000 | Freq: Once | ORAL | Status: AC

## 2012-08-29 NOTE — Progress Notes (Addendum)
08/29/2012 Jose Roth [id number] 09/09/1975   History of Present Illness: Patient presents today with the same complaints that he just over a week ago when he was seen by Dr. Rhea Belton.  Repeat CT scan showed no abnormalities to account for his symptoms and comprehensive stool panel was negative.  He is finishing the cipro and has been taking a probiotic every day.  Is also taking levsin as directed, however, he has not seen any improvement in his symptoms.  Having 4 episodes of diarrhea a day and has a "sore bottom" from going so much.  Vomited twice yesterday and is nauseous constantly.  Abdominal pain is still present on the right side of the abdomen.  Says that he cannot sleep.  Was supposed to return to work yesterday, but "cannot work like this".  Just of note, after some research it was found that he has been seen in the ED at [facility] 28 times since 2008 (per our nursing staff).  Current Medications, Allergies, Past Medical History, Past Surgical History, Family History and Social History were reviewed in [employer] record.   Physical Exam: BP 100/78  Pulse 80  Ht 5\' 9"  (1.753 m)  Wt 174 lb 6.4 oz (79.107 kg)  BMI 25.75 kg/m2 General: Well developed , white male in no acute distress Head: Normocephalic and atraumatic Eyes:  sclerae anicteric, conjunctiva pink  Ears: Normal auditory acuity Lungs: Clear throughout to auscultation Heart: Regular rate and rhythm Abdomen: Soft, non-distended.  Normal bowel sounds.  Diffuse TTP > in the right side of the abdomen without R/R/G. Musculoskeletal: Symmetrical with no gross deformities  Extremities: No edema  Neurological: Alert oriented x 4, grossly nonfocal Psychological:  Alert and cooperative. Normal mood and affect  Assessment and Recommendations: 1. Mid abdominal pain/nausea, vomiting/diarrhea --No further improvement in his symptoms.  Discussed with Dr. Rhea Belton who agrees to schedule EGD/colonoscopy to  complete evaluation.  He is to finish out the course of his cipro and continue the probiotic and compazine.  Can use OTC Balmex for sore bottom.    Addendum: Reviewed and agree with management. Objective workup at this point has failed to reveal source for the patient's symptoms EGD will help exclude gastritis duodenitis oral for disease. Colonoscopy will help exclude inflammatory bowel disease and we will plan to investigate the terminal ileum if possible to exclude ileitis I would like to obtain his prior endoscopy records Beverley Fiedler, MD

## 2012-08-29 NOTE — Patient Instructions (Addendum)
You have been scheduled for an endoscopy and colonoscopy with propofol. Please follow the written instructions given to you at your visit today. Please pick up your prep at the pharmacy within the next 1-3 days. We sent the prescription for the prep to [facility],  [place], [place]. We have given you a rebate coupon for $20.00.  If you use inhalers (even only as needed) or a CPAP machine, please bring them with you on the day of your procedure.

## 2012-09-01 ENCOUNTER — Other Ambulatory Visit (INDEPENDENT_AMBULATORY_CARE_PROVIDER_SITE_OTHER): Payer: Managed Care, Other (non HMO)

## 2012-09-01 ENCOUNTER — Telehealth: Payer: Self-pay | Admitting: Internal Medicine

## 2012-09-01 DIAGNOSIS — R112 Nausea with vomiting, unspecified: Secondary | ICD-10-CM

## 2012-09-01 DIAGNOSIS — R109 Unspecified abdominal pain: Secondary | ICD-10-CM

## 2012-09-01 LAB — LIPASE: Lipase: 29 U/L (ref 11.0–59.0)

## 2012-09-01 LAB — CBC WITH DIFFERENTIAL/PLATELET
Basophils Absolute: 0 10*3/uL (ref 0.0–0.1)
HCT: 44.3 % (ref 39.0–52.0)
Hemoglobin: 15.2 g/dL (ref 13.0–17.0)
Lymphs Abs: 3 10*3/uL (ref 0.7–4.0)
Monocytes Relative: 8.7 % (ref 3.0–12.0)
Neutro Abs: 7.2 10*3/uL (ref 1.4–7.7)
RDW: 13.4 % (ref 11.5–14.6)

## 2012-09-01 LAB — COMPREHENSIVE METABOLIC PANEL
ALT: 68 U/L — ABNORMAL HIGH (ref 0–53)
BUN: 9 mg/dL (ref 6–23)
CO2: 28 mEq/L (ref 19–32)
Creatinine, Ser: 1.1 mg/dL (ref 0.4–1.5)
GFR: 84.61 mL/min (ref >60.00)
Glucose, Bld: 105 mg/dL — ABNORMAL HIGH (ref 70–99)
Total Bilirubin: 0.5 mg/dL (ref 0.3–1.2)

## 2012-09-01 LAB — AMYLASE: Amylase: 70 U/L (ref 27–131)

## 2012-09-01 MED ORDER — ONDANSETRON 4 MG PO TBDP: 4.0000 mg | ORAL_TABLET | Freq: Three times a day (TID) | ORAL | Status: DC | PRN

## 2012-09-01 NOTE — Telephone Encounter (Signed)
Pt reports some vomiting on 08/29/12 and last pm prior to going to work, the vomiting increased so bad he could not go in. Spoke with Dr Rhea Belton who ordered labs, instructed pt to follow the BRAT diet, order Zofran, and instruct pt to keep hydrated. Informed pt to come for labs, follow the diet, increase his hydration. I asked if Compazine worked for him and he stated no, nothing so far has helped. Informed him we are ordering him Zofran. He asked for a note for work and then his wife called back and wanted to know how long "are we putting him out of work for?" and I informed her last night a and until his labs come back. Pt states he can't come in until his daughter gets home from school; she has the car. I will change the note to Sunday and Monday PM since the pt works 3rd shift. Fax to [phone]

## 2012-09-03 ENCOUNTER — Telehealth: Payer: Self-pay | Admitting: Internal Medicine

## 2012-09-03 ENCOUNTER — Other Ambulatory Visit: Payer: Self-pay | Admitting: *Deleted

## 2012-09-03 ENCOUNTER — Encounter: Payer: Self-pay | Admitting: Internal Medicine

## 2012-09-03 DIAGNOSIS — R1110 Vomiting, unspecified: Secondary | ICD-10-CM

## 2012-09-03 DIAGNOSIS — R109 Unspecified abdominal pain: Secondary | ICD-10-CM

## 2012-09-03 NOTE — Telephone Encounter (Signed)
Informed pt Dr Rhea Belton would like to go ahead and at least do an EGD to determine his source of nausea, vomiting and possibly, abdominal pain. Instructed him to be NPO after midnight and to check in at [facility] Admitting in am at 07:45am tomorrow. Pt stated understanding. ECL changed to COLON only in [facility] on 09/18/12.

## 2012-09-04 ENCOUNTER — Encounter (HOSPITAL_COMMUNITY)
Admission: RE | Disposition: A | Discharge status: Home / Self Care | Payer: Self-pay | Source: Ambulatory Visit | Attending: Internal Medicine

## 2012-09-04 ENCOUNTER — Encounter (HOSPITAL_COMMUNITY): Payer: Self-pay | Admitting: *Deleted

## 2012-09-04 ENCOUNTER — Ambulatory Visit (HOSPITAL_COMMUNITY)
Admission: RE | Admit: 2012-09-04 | Discharge: 2012-09-04 | Disposition: A | Discharge status: Home / Self Care | Payer: Managed Care, Other (non HMO) | Source: Ambulatory Visit | Attending: Internal Medicine | Admitting: Internal Medicine

## 2012-09-04 ENCOUNTER — Other Ambulatory Visit: Payer: Self-pay | Admitting: *Deleted

## 2012-09-04 DIAGNOSIS — K7689 Other specified diseases of liver: Secondary | ICD-10-CM | POA: Insufficient documentation

## 2012-09-04 DIAGNOSIS — R197 Diarrhea, unspecified: Secondary | ICD-10-CM | POA: Insufficient documentation

## 2012-09-04 DIAGNOSIS — R109 Unspecified abdominal pain: Secondary | ICD-10-CM

## 2012-09-04 DIAGNOSIS — R1110 Vomiting, unspecified: Secondary | ICD-10-CM

## 2012-09-04 DIAGNOSIS — K2940 Chronic atrophic gastritis without bleeding: Secondary | ICD-10-CM | POA: Insufficient documentation

## 2012-09-04 DIAGNOSIS — Q619 Cystic kidney disease, unspecified: Secondary | ICD-10-CM | POA: Insufficient documentation

## 2012-09-04 DIAGNOSIS — R1013 Epigastric pain: Secondary | ICD-10-CM | POA: Insufficient documentation

## 2012-09-04 DIAGNOSIS — N289 Disorder of kidney and ureter, unspecified: Secondary | ICD-10-CM | POA: Insufficient documentation

## 2012-09-04 DIAGNOSIS — R112 Nausea with vomiting, unspecified: Secondary | ICD-10-CM | POA: Insufficient documentation

## 2012-09-04 DIAGNOSIS — R7402 Elevation of levels of lactic acid dehydrogenase (LDH): Secondary | ICD-10-CM | POA: Insufficient documentation

## 2012-09-04 DIAGNOSIS — K5730 Diverticulosis of large intestine without perforation or abscess without bleeding: Secondary | ICD-10-CM | POA: Insufficient documentation

## 2012-09-04 DIAGNOSIS — R7401 Elevation of levels of liver transaminase levels: Secondary | ICD-10-CM | POA: Insufficient documentation

## 2012-09-04 DIAGNOSIS — Z79899 Other long term (current) drug therapy: Secondary | ICD-10-CM | POA: Insufficient documentation

## 2012-09-04 HISTORY — PX: ESOPHAGOGASTRODUODENOSCOPY: SHX5428

## 2012-09-04 SURGERY — EGD (ESOPHAGOGASTRODUODENOSCOPY)
Anesthesia: Moderate Sedation

## 2012-09-04 MED ORDER — FENTANYL CITRATE 0.05 MG/ML IJ SOLN
INTRAMUSCULAR | Status: AC
  Filled 2012-09-04: qty 4

## 2012-09-04 MED ORDER — MIDAZOLAM HCL 10 MG/2ML IJ SOLN
INTRAMUSCULAR | Status: DC | PRN
  Administered 2012-09-04: 2 mg via INTRAVENOUS
  Administered 2012-09-04: 3 mg via INTRAVENOUS
  Administered 2012-09-04 (×2): 2 mg via INTRAVENOUS
  Administered 2012-09-04: 1 mg via INTRAVENOUS

## 2012-09-04 MED ORDER — DIPHENHYDRAMINE HCL 50 MG/ML IJ SOLN
INTRAMUSCULAR | Status: DC | PRN
  Administered 2012-09-04: 25 mg via INTRAVENOUS

## 2012-09-04 MED ORDER — SODIUM CHLORIDE 0.9 % IV SOLN
INTRAVENOUS | Status: DC
  Administered 2012-09-04: 500 mL via INTRAVENOUS

## 2012-09-04 MED ORDER — DIPHENHYDRAMINE HCL 50 MG/ML IJ SOLN
INTRAMUSCULAR | Status: AC
  Filled 2012-09-04: qty 1

## 2012-09-04 MED ORDER — MIDAZOLAM HCL 10 MG/2ML IJ SOLN
INTRAMUSCULAR | Status: AC
  Filled 2012-09-04: qty 4

## 2012-09-04 MED ORDER — FENTANYL CITRATE 0.05 MG/ML IJ SOLN
INTRAMUSCULAR | Status: DC | PRN
  Administered 2012-09-04: 12.5 ug via INTRAVENOUS
  Administered 2012-09-04: 50 ug via INTRAVENOUS
  Administered 2012-09-04: 25 ug via INTRAVENOUS
  Administered 2012-09-04: 12.5 ug via INTRAVENOUS

## 2012-09-04 NOTE — H&P (View-Only) (Signed)
Patient ID: Jose Roth, male   DOB: November 16, 1975, 37 y.o.   MRN: [id number]  SUBJECTIVE: HPI Jose Roth is a 37 yo male seen auscultation of the request of Dr. Ivan Anchors for evaluation of abdominal pain, nausea vomiting and diarrhea.  The patient reports his symptoms started abruptly 2 weeks ago initially a severe nausea and vomiting with diarrhea. His diarrhea was initially 8-10 times per day, but nonbloody. He also denies melena. He has been evaluated both by primary care and in the emergency department throughout this illness. His vomiting has resolved but he still is having issues with nausea on a daily basis. His diarrhea has also waned to some degree, and he is only having approximately 2 stools per day. His stools remain loose, not formed, and nonbloody.  He is continue to have mid abdominal pain that also radiates towards the right. He's noted some low-grade fevers less than 100F as well as chills. He does report he does have an appetite and is able to eat.  He had spaghetti for dinner last night. He also notes some abdominal bloating. He remains on ciprofloxacin and metronidazole. He is using metoclopramide for nausea. He was also started on omeprazole. He was on no medications prior to becoming ill. He is also using oxycodone which helps with the pain. No sick contacts. His wife has had no GI complaints.  Review of Systems  As per history of present illness, otherwise negative   Past Medical History  Diagnosis Date  . High triglycerides   . Hypertriglyceridemia 07/11/2012    Current Outpatient Prescriptions  Medication Sig Dispense Refill  . ciprofloxacin (CIPRO) 500 MG tablet Take 1 tablet (500 mg total) by mouth 2 (two) times daily.  60 tablet  0  . hyoscyamine (LEVSIN, ANASPAZ) 0.125 MG tablet Take 1 tablet (0.125 mg total) by mouth every 4 (four) hours as needed for cramping.  30 tablet  0  . metoCLOPramide (REGLAN) 10 MG tablet Take 1 tablet (10 mg total) by mouth 3 (three) times daily  as needed. For nausea  60 tablet  0  . metroNIDAZOLE (FLAGYL) 500 MG tablet Take 1 tablet (500 mg total) by mouth 3 (three) times daily.  21 tablet  0  . omeprazole (PRILOSEC) 20 MG capsule Take 1 capsule (20 mg total) by mouth daily.  20 capsule  0  . oxyCODONE-acetaminophen (PERCOCET/ROXICET) 5-325 MG per tablet Take 2 tablets by mouth every 4 (four) hours as needed for pain.  30 tablet  0  . Probiotic Product (PROBIOTIC FORMULA) CAPS Take 1 capsule by mouth daily.  30 capsule  0    Allergies  Allergen Reactions  . Lyrica (Pregabalin)   . Morphine And Related   . Tramadol     Family History  Problem Relation Age of Onset  . Rheum arthritis Mother   . COPD Father   . Colon cancer Neg Hx   . Colon polyps Father     History  Substance Use Topics  . Smoking status: Current Every Day Smoker -- 0.5 packs/day for 16 years    Types: Cigarettes  . Smokeless tobacco: Not on file     Comment: form given 08-19-12.  [name] Alcohol Use: Yes     Comment: social     OBJECTIVE: BP 106/70  Pulse 76  Ht 5\' 9"  (1.753 m)  Wt 177 lb 6.4 oz (80.468 kg)  BMI 26.20 kg/m2 Constitutional: Well-developed and well-nourished. No distress. HEENT: Normocephalic and atraumatic. Oropharynx is clear and moist. No  oropharyngeal exudate. Conjunctivae are normal. No scleral icterus. Edentulous Neck: Neck supple. Trachea midline. Cardiovascular: Normal rate, regular rhythm and intact distal pulses. No M/R/G Pulmonary/chest: Effort normal and breath sounds normal. No wheezing, rales or rhonchi. Abdominal: Soft, moderate mid and lower abdominal tenderness without rebound or guarding, nondistended. Bowel sounds active throughout. There are no masses palpable. No hepatosplenomegaly. Extremities: no clubbing, cyanosis, or edema Lymphadenopathy: No cervical adenopathy noted. Neurological: Alert and oriented to person place and time. Skin: Skin is warm and dry. No rashes noted. Psychiatric: Normal mood and affect.  Behavior is normal.  Labs and Imaging -- CBC    Component Value Date/Time   WBC 13.2* 08/17/2012 2252   RBC 5.19 08/17/2012 2252   HGB 15.7 08/17/2012 2252   HCT 45.1 08/17/2012 2252   PLT 216 08/17/2012 2252   MCV 86.9 08/17/2012 2252   MCH 30.3 08/17/2012 2252   MCHC 34.8 08/17/2012 2252   RDW 13.4 08/17/2012 2252   LYMPHSABS 3.4 08/17/2012 2252   MONOABS 1.2* 08/17/2012 2252   EOSABS 0.4 08/17/2012 2252   BASOSABS 0.0 08/17/2012 2252    CMP     Component Value Date/Time   NA 138 08/17/2012 2252   K 3.8 08/17/2012 2252   CL 101 08/17/2012 2252   CO2 25 08/17/2012 2252   GLUCOSE 97 08/17/2012 2252   BUN 11 08/17/2012 2252   CREATININE 1.10 08/17/2012 2252   CREATININE 0.91 08/14/2012 1008   CALCIUM 9.2 08/17/2012 2252   PROT 7.4 08/17/2012 2252   ALBUMIN 4.1 08/17/2012 2252   AST 43* 08/17/2012 2252   ALT 96* 08/17/2012 2252   ALKPHOS 54 08/17/2012 2252   BILITOT 0.2* 08/17/2012 2252   GFRNONAA 86* 08/17/2012 2252   GFRAA >90 08/17/2012 2252    Lipase     Component Value Date/Time   LIPASE 30 08/17/2012 2252  COMPLETE ABDOMINAL ULTRASOUND   Comparison:  CT abdomen pelvis 08/11/2012.   Findings:   Gallbladder:  No gallstones, gallbladder wall thickening, or pericholecystic fluid.   Common bile duct:  Visualization is somewhat limited by bowel gas. Measures up to approximately 3 mm, within normal limits.   Liver:  No focal lesion identified.  Within normal limits in parenchymal echogenicity.   IVC:  Obscured by bowel gas.   Pancreas:  Obscured by bowel gas.   Spleen:  Measures 9.3 cm, negative.  Small splenule is seen in the hilum.   Right Kidney:  Measures 9.7 cm with a 1.0 cm nearly anechoic lesion off the lower pole, most consistent with a cyst.  No hydronephrosis.  No solid lesions.   Left Kidney:  Measures 9.1 cm.  Parenchymal echogenicity is normal. No hydronephrosis.  No focal lesions.   Abdominal aorta:  Visualization is somewhat limited by bowel gas. Measures up  to approximately 2.2 cm.   IMPRESSION:   1.  Examination is somewhat limited by bowel gas. 2.  No acute findings.   CT ABDOMEN AND PELVIS WITH CONTRAST   Technique:  Multidetector CT imaging of the abdomen and pelvis was performed following the standard protocol during bolus administration of intravenous contrast.   Contrast: OMNIPAQUE IOHEXOL 300 MG/ML  SOLN   Comparison: None.   Findings: Radiopaque material within the appendix may reflect ingest material versus small appendicoliths.  No evidence of inflammation of the appendix.   Status small number of diverticula predominate left colon without extraluminal bowel inflammatory process, free fluid or free air.   Mild fatty infiltration liver without focal  mass.   Contracted gallbladder without calcified gallstones.  Spleen is at the upper limits of normal in size spanning over 12.8 cm length without focal mass.   No focal pancreatic lesion or adrenal lesion.   1 cm inferior pole right renal cyst.  Tiny low density structures within both kidneys too small to adequately characterize statistically likely cysts.  Tiny right angio myolipoma not entirely excluded.   Lung bases clear.  No abdominal aortic aneurysm.   Top normal sized left external iliac lymph node without adenopathy noted.   No bony destructive lesion.   IMPRESSION: No extraluminal bowel inflammatory process, free fluid or free air. Specifically, no evidence to suggest appendicitis.  Appendicoliths may be present. Scattered small number of colonic diverticula.   Spleen top normal in size.   Mild fatty infiltration liver.   Right renal cyst.  Tiny low density lesions in both kidneys too small to adequately characterize statistically likely small cysts.   ASSESSMENT AND PLAN:  37 yo male seen auscultation of the request of Dr. Ivan Anchors for evaluation of abdominal pain, nausea vomiting and diarrhea.    1.  Mid abdominal pain/nausea,  vomiting/diarrhea -- his symptoms are most consistent with an acute infectious gastrointestinal illness.  Things have improved somewhat specifically the vomiting and loose stools/diarrhea. He is tender on exam today but nontoxic without concern for obstruction.  I recommend stool studies with a GI pathogen panel which will help evaluate for viral, bacterial and parasitic infection.  Also feel this point he should continue and complete his course of ciprofloxacin and metronidazole. I like for him to start a probiotic and he is given samples. He'll return in 2 weeks, but he is asked to contact us if his symptoms worsen. If they do worsen or fail to continue to improve, I would likely repeat the CT scan of the abdomen and pelvis.  Finally, I will give him a prescription for Percocet 5/325 #30 to help with his pain. He is advised this will not be an ongoing or chronic prescription.  2.  Mild transaminitis -- this may be secondary to fatty liver disease, but also could be reactive around this intestinal illness.  We will follow this going forward, and can discuss it further once he improves from his acute illness

## 2012-09-04 NOTE — Op Note (Signed)
[facility] [address] [place], [address]   ENDOSCOPY PROCEDURE REPORT  PATIENT: Jose, Roth  MR#: [id number] BIRTHDATE: 1975-12-31 , 36  yrs. old GENDER: Male ENDOSCOPIST: Beverley Fiedler, MD REFERRED BY: PROCEDURE DATE:  09/04/2012 PROCEDURE:  EGD w/ biopsy for H.pylori ASA CLASS:     Class II INDICATIONS:  Nausea.   Vomiting.   Epigastric pain. MEDICATIONS: Diphenhydramine (Benadryl) 25 mg IV, Fentanyl 100 mcg IV, and Versed 10 mg IV TOPICAL ANESTHETIC: Cetacaine Spray  DESCRIPTION OF PROCEDURE: After the risks benefits and alternatives of the procedure were thoroughly explained, informed consent was obtained.  The Pentax Gastroscope [id number] endoscope was introduced through the mouth and advanced to the second portion of the duodenum. Without limitations.  The instrument was slowly withdrawn as the mucosa was fully examined.      The upper, middle and distal third of the esophagus were carefully inspected and no abnormalities were noted.  The z-line was well seen at the GEJ, 40 cm from the incisors.  The endoscope was pushed into the fundus which was normal including a retroflexed view.  The antrum, gastric body, first and second part of the duodenum were unremarkable.  Biopsies were taken in the antrum and angularis. Retroflexed views revealed no abnormalities.     The scope was then withdrawn from the patient and the procedure completed.  COMPLICATIONS: There were no complications.  ENDOSCOPIC IMPRESSION: Normal EGD; biopsies were taken in the antrum and angularis to exclude H. Pylori infection.  RECOMMENDATIONS: 1.  Await pathology results 2.  Follow-up of helicobacter pylori status, treat if indicated 3.  Continue medications for nausea/vomiting control.  Can proceed to outpatient colonoscopy for further investigation of change in bowel habits and abdominal pain   eSigned:  Beverley Fiedler, MD 09/04/2012 9:33 AM   CC:The Patient  and  Laren Boom, MD

## 2012-09-04 NOTE — Interval H&P Note (Signed)
History and Physical Interval Note: Ongoing nausea and vomiting.  Still with epigastric and RUQ pain worse after eating.  No hematemesis.  Loose stools, but not diarrhea.  No rectal bleeding or melena. The nature of the procedure, as well as the risks, benefits, and alternatives were carefully and thoroughly reviewed with the patient. Ample time for discussion and questions allowed. The patient understood, was satisfied, and agreed to proceed.     09/04/2012 9:46 AM  Jose Roth  has presented today for surgery, with the diagnosis of nausea vomiting abd.pain  The various methods of treatment have been discussed with the patient and family. After consideration of risks, benefits and other options for treatment, the patient has consented to  Procedure(s) (LRB) with comments: ESOPHAGOGASTRODUODENOSCOPY (EGD) (N/A) as a surgical intervention .  The patient's history has been reviewed, patient examined, no change in status, stable for surgery.  I have reviewed the patient's chart and labs.  Questions were answered to the patient's satisfaction.     PYRTLE, JAY M

## 2012-09-05 ENCOUNTER — Telehealth: Payer: Self-pay | Admitting: *Deleted

## 2012-09-05 ENCOUNTER — Encounter (HOSPITAL_COMMUNITY): Payer: Self-pay | Admitting: Internal Medicine

## 2012-09-05 NOTE — Telephone Encounter (Signed)
Informed pt's wife that he is scheduled for a HIDA CCK scan on 09/11/12 at [facility], arrive at 12:15PM and NPO 6 hours prior. She stated understanding.

## 2012-09-09 ENCOUNTER — Ambulatory Visit: Payer: Managed Care, Other (non HMO) | Admitting: Internal Medicine

## 2012-09-10 ENCOUNTER — Encounter: Payer: Self-pay | Admitting: Internal Medicine

## 2012-09-11 ENCOUNTER — Encounter (HOSPITAL_COMMUNITY)
Admission: RE | Admit: 2012-09-11 | Discharge: 2012-09-11 | Disposition: A | Discharge status: Home / Self Care | Payer: Managed Care, Other (non HMO) | Source: Ambulatory Visit | Attending: Internal Medicine | Admitting: Internal Medicine

## 2012-09-11 ENCOUNTER — Encounter (HOSPITAL_COMMUNITY): Payer: Self-pay

## 2012-09-11 DIAGNOSIS — R109 Unspecified abdominal pain: Secondary | ICD-10-CM | POA: Insufficient documentation

## 2012-09-11 DIAGNOSIS — R197 Diarrhea, unspecified: Secondary | ICD-10-CM | POA: Insufficient documentation

## 2012-09-11 DIAGNOSIS — R112 Nausea with vomiting, unspecified: Secondary | ICD-10-CM | POA: Insufficient documentation

## 2012-09-11 MED ORDER — TECHNETIUM TC 99M MEBROFENIN IV KIT
5.0000 | PACK | Freq: Once | INTRAVENOUS | Status: AC | PRN
  Administered 2012-09-11: 5 via INTRAVENOUS

## 2012-09-17 ENCOUNTER — Telehealth: Payer: Self-pay | Admitting: Gastroenterology

## 2012-09-17 NOTE — Telephone Encounter (Signed)
Spoke to pt's wife, moved pt's appointment to an earlier time slot. Went over instructions for prep again.

## 2012-09-18 ENCOUNTER — Encounter: Payer: Managed Care, Other (non HMO) | Admitting: Internal Medicine

## 2012-09-23 ENCOUNTER — Ambulatory Visit: Payer: Managed Care, Other (non HMO) | Admitting: Internal Medicine

## 2012-09-29 ENCOUNTER — Ambulatory Visit (AMBULATORY_SURGERY_CENTER): Payer: Managed Care, Other (non HMO) | Admitting: Internal Medicine

## 2012-09-29 ENCOUNTER — Encounter: Payer: Self-pay | Admitting: Internal Medicine

## 2012-09-29 ENCOUNTER — Encounter: Payer: Managed Care, Other (non HMO) | Admitting: Internal Medicine

## 2012-09-29 ENCOUNTER — Telehealth: Payer: Self-pay | Admitting: *Deleted

## 2012-09-29 VITALS — BP 110/68 | HR 82 | Temp 98.0°F | Resp 22 | Ht 69.0 in | Wt 174.0 lb

## 2012-09-29 DIAGNOSIS — R109 Unspecified abdominal pain: Secondary | ICD-10-CM

## 2012-09-29 DIAGNOSIS — R197 Diarrhea, unspecified: Secondary | ICD-10-CM

## 2012-09-29 DIAGNOSIS — R112 Nausea with vomiting, unspecified: Secondary | ICD-10-CM

## 2012-09-29 MED ORDER — SODIUM CHLORIDE 0.9 % IV SOLN: 500.0000 mL | INTRAVENOUS | Status: DC

## 2012-09-29 MED ORDER — HYOSCYAMINE SULFATE 0.125 MG PO TABS: 0.1250 mg | ORAL_TABLET | ORAL | Status: DC | PRN

## 2012-09-29 NOTE — Op Note (Signed)
[facility] [address] [place], [address]   COLONOSCOPY PROCEDURE REPORT  PATIENT: Jose Roth, Jose Roth  MR#: [id number] BIRTHDATE: 04-Feb-1976 , 36  yrs. old GENDER: Male ENDOSCOPIST: Beverley Fiedler, MD PROCEDURE DATE:  09/29/2012 PROCEDURE:   Colonoscopy, diagnostic ASA CLASS:   Class II INDICATIONS:Abdominal pain and Change in bowel habits. MEDICATIONS: MAC sedation, administered by CRNA and propofol (Diprivan) 200mg  IV  DESCRIPTION OF PROCEDURE:   After the risks benefits and alternatives of the procedure were thoroughly explained, informed consent was obtained.  A digital rectal exam revealed no rectal mass.   The LB [id number]  endoscope was introduced through the anus and advanced to the terminal ileum which was intubated for a short distance. No adverse events experienced.   The quality of the prep was good, using MoviPrep  The instrument was then slowly withdrawn as the colon was fully examined.    COLON FINDINGS: The mucosa appeared normal in the terminal ileum. A normal appearing cecum, ileocecal valve, and appendiceal orifice were identified.  The ascending, hepatic flexure, transverse, splenic flexure, descending, sigmoid colon and rectum appeared unremarkable.  No polyps or cancers were seen.  Retroflexed views revealed no abnormalities. The time to cecum=2 minutes 01 seconds. Withdrawal time=9 minutes 40 seconds.  The scope was withdrawn and the procedure completed.  COMPLICATIONS: There were no complications.  ENDOSCOPIC IMPRESSION: 1.   Normal mucosa in the terminal ileum 2.   Normal colon  RECOMMENDATIONS: 1.  Can continue Levsin as needed and as directed for cramping abdominal pain 2.  Continue daily probiotic 3.  Repeat colonoscopy recommended to begin at age 75 for screening (unless there is a change in family history) 4.  Office follow-up as needed   eSigned:  Beverley Fiedler, MD 09/29/2012 10:54 AM   cc: The Patient ;  Laren Boom, MD

## 2012-09-29 NOTE — Patient Instructions (Addendum)
YOU HAD AN ENDOSCOPIC PROCEDURE TODAY AT THE [facility]: Refer to the procedure report that was given to you for any specific questions about what was found during the examination.  If the procedure report does not answer your questions, please call your gastroenterologist to clarify.  If you requested that your care partner not be given the details of your procedure findings, then the procedure report has been included in a sealed envelope for you to review at your convenience later.  YOU SHOULD EXPECT: Some feelings of bloating in the abdomen. Passage of more gas than usual.  Walking can help get rid of the air that was put into your GI tract during the procedure and reduce the bloating. If you had a lower endoscopy (such as a colonoscopy or flexible sigmoidoscopy) you may notice spotting of blood in your stool or on the toilet paper. If you underwent a bowel prep for your procedure, then you may not have a normal bowel movement for a few days.  DIET: Your first meal following the procedure should be a light meal and then it is ok to progress to your normal diet.  A half-sandwich or bowl of soup is an example of a good first meal.  Heavy or fried foods are harder to digest and may make you feel nauseous or bloated.  Likewise meals heavy in dairy and vegetables can cause extra gas to form and this can also increase the bloating.  Drink plenty of fluids but you should avoid alcoholic beverages for 24 hours.  ACTIVITY: Your care partner should take you home directly after the procedure.  You should plan to take it easy, moving slowly for the rest of the day.  You can resume normal activity the day after the procedure however you should NOT DRIVE or use heavy machinery for 24 hours (because of the sedation medicines used during the test).    SYMPTOMS TO REPORT IMMEDIATELY: A gastroenterologist can be reached at any hour.  During normal business hours, 8:30 AM to 5:00 PM Monday through Friday,  call [phone].  After hours and on weekends, please call the GI answering service at [phone] emergency number  who will take a message and have the physician on call contact you.   Following lower endoscopy (colonoscopy or flexible sigmoidoscopy):  Excessive amounts of blood in the stool  Significant tenderness or worsening of abdominal pains  Swelling of the abdomen that is new, acute  Fever of 100F or higher FOLLOW UP: If any biopsies were taken you will be contacted by phone or by letter within the next 1-3 weeks.  Call your gastroenterologist if you have not heard about the biopsies in 3 weeks.  Our staff will call the home number listed on your records the next business day following your procedure to check on you and address any questions or concerns that you may have at that time regarding the information given to you following your procedure. This is a courtesy call and so if there is no answer at the home number and we have not heard from you through the emergency physician on call, we will assume that you have returned to your regular daily activities without incident.  SIGNATURES/CONFIDENTIALITY: You and/or your care partner have signed paperwork which will be entered into your electronic medical record.  These signatures attest to the fact that that the information above on your After Visit Summary has been reviewed and is understood.  Full responsibility of the confidentiality  of this discharge information lies with you and/or your care-partner.  Can continue levsin as needed for cramping Continue daily probiotic Next colonoscopy at age 77 See dr pyrtle as needed in his office.

## 2012-09-29 NOTE — Telephone Encounter (Signed)
Medication refill for levsin sent as verbal order given by dr pyrtle at procedure today. ewm

## 2012-09-29 NOTE — Progress Notes (Signed)
Patient did not experience any of the following events: a burn prior to discharge; a fall within the facility; wrong site/side/patient/procedure/implant event; or a hospital transfer or hospital admission upon discharge from the facility. (G8907)Patient did not have preoperative order for IV antibiotic SSI prophylaxis. (G8918) ewm 

## 2012-09-30 ENCOUNTER — Telehealth: Payer: Self-pay | Admitting: *Deleted

## 2012-09-30 NOTE — Telephone Encounter (Signed)
  Follow up Call-  Call back number 09/29/2012  Permission to leave phone message Yes     Patient questions:  Do you have a fever, pain , or abdominal swelling? no Pain Score  0 *  Have you tolerated food without any problems? yes  Have you been able to return to your normal activities? yes  Do you have any questions about your discharge instructions: Diet   no Medications  no Follow up visit  no  Do you have questions or concerns about your Care? no  Actions: * If pain score is 4 or above: No action needed, pain <4.

## 2012-10-02 ENCOUNTER — Encounter: Payer: Self-pay | Admitting: Internal Medicine

## 2012-10-07 ENCOUNTER — Ambulatory Visit: Payer: Managed Care, Other (non HMO) | Admitting: Internal Medicine

## 2012-10-23 ENCOUNTER — Encounter: Payer: Self-pay | Admitting: Internal Medicine

## 2012-10-29 ENCOUNTER — Ambulatory Visit: Payer: Managed Care, Other (non HMO) | Admitting: Internal Medicine

## 2014-04-21 ENCOUNTER — Encounter: Payer: Self-pay | Admitting: Family Medicine

## 2014-04-21 DIAGNOSIS — F1910 Other psychoactive substance abuse, uncomplicated: Secondary | ICD-10-CM | POA: Insufficient documentation

## 2014-08-31 ENCOUNTER — Ambulatory Visit (INDEPENDENT_AMBULATORY_CARE_PROVIDER_SITE_OTHER): Payer: BLUE CROSS/BLUE SHIELD | Admitting: Family Medicine

## 2014-08-31 ENCOUNTER — Encounter: Payer: Self-pay | Admitting: Family Medicine

## 2014-08-31 ENCOUNTER — Telehealth: Payer: Self-pay | Admitting: *Deleted

## 2014-08-31 VITALS — BP 151/72 | HR 112 | Temp 98.4°F | Wt 172.0 lb

## 2014-08-31 DIAGNOSIS — R52 Pain, unspecified: Secondary | ICD-10-CM

## 2014-08-31 DIAGNOSIS — B9689 Other specified bacterial agents as the cause of diseases classified elsewhere: Secondary | ICD-10-CM

## 2014-08-31 DIAGNOSIS — J329 Chronic sinusitis, unspecified: Secondary | ICD-10-CM | POA: Diagnosis not present

## 2014-08-31 DIAGNOSIS — A499 Bacterial infection, unspecified: Secondary | ICD-10-CM

## 2014-08-31 MED ORDER — DOXYCYCLINE HYCLATE 100 MG PO TABS: ORAL_TABLET | ORAL | Status: DC

## 2014-08-31 NOTE — Telephone Encounter (Signed)
Pt states his copay on the the doxy is $25 and he cannot afford this. He wants to know if there is something cheaper

## 2014-08-31 NOTE — Progress Notes (Signed)
CC: Jose Roth is a 39 y.o. male is here for Fatigue   Subjective: HPI:  Fatigue, body aches, nonproductive cough and facial pressure in the forehead with postnasal drip that came on acutely and abruptly last night. Symptoms are moderate to severe in severity. Since 5 PM yesterday he's been mostly just sleeping other than waking up to go eat or use the bathroom. Interventions have included Advil Cold and Sinus and Tylenol neither which seem to help much. Symptoms are not Better or Worse since Onset. Present All Hours of the Day. Reports questionable fevers and chills. Denies wheezing, shortness of breath, abdominal pain, diarrhea, constipation, dysuria, nausea or shortness of breath   Review Of Systems Outlined In HPI  Past Medical History  Diagnosis Date  . High triglycerides   . Hypertriglyceridemia 07/11/2012  . Gastritis     Past Surgical History  Procedure Laterality Date  . Tonsillectomy    . Cyst removed  2013    from back of head  . Esophagogastroduodenoscopy  09/04/2012    Procedure: ESOPHAGOGASTRODUODENOSCOPY (EGD);  Surgeon: Beverley FiedlerJay M Pyrtle, MD;  Location: [facility];  Service: Gastroenterology;  Laterality: N/A;   Family History  Problem Relation Age of Onset  . Rheum arthritis Mother   . COPD Father   . Colon cancer Neg Hx   . Colon polyps Father     History   Social History  . Marital Status: Married    Spouse Name: N/A    Number of Children: N/A  . Years of Education: N/A   Occupational History  . Not on file.   Social History Main Topics  . Smoking status: Current Every Day Smoker -- 0.50 packs/day for 16 years    Types: Cigarettes  . Smokeless tobacco: Never Used     Comment: form given 08-19-12.  [name]. Alcohol Use: Yes     Comment: social   . Drug Use: No  . Sexual Activity: Not on file   Other Topics Concern  . Not on file   Social History Narrative     Objective: BP 151/72 mmHg  Pulse 112  Temp(Src) 98.4 F (36.9 C) (Oral)  Wt 172 lb  (78.019 kg)  SpO2 95%  General: Alert and Oriented, No Acute Distress appears mildly fatigued HEENT: Pupils equal, round, reactive to light. Conjunctivae clear.  External ears unremarkable, canals clear with intact TMs with appropriate landmarks.  Middle ear appears open without effusion. Pink inferior turbinates.  Moist mucous membranes, pharynx without inflammation nor lesions however moderate postnasal drip.  Neck supple without palpable lymphadenopathy nor abnormal masses. Lungs: Clear to auscultation bilaterally, no wheezing/ronchi/rales.  Comfortable work of breathing. Good air movement.frequent coughing Cardiac: Regular rate and rhythm. Normal S1/S2.  No murmurs, rubs, nor gallops.   Mental Status: No depression, anxiety, nor agitation. Skin: Warm and dry.  Assessment & Plan: [name] was seen today for fatigue.  Diagnoses and associated orders for this visit:  Body aches - POCT Influenza A/B  Bacterial sinusitis - doxycycline (VIBRA-TABS) 100 MG tablet; One by mouth twice a day for ten days.    Rapid flu test is negative, given the severity of his symptoms I'm concerned for bacterial sinusitis more likely than viral sinusitis therefore start doxycycline. He is taking meloxicam on a daily basis so I encouraged him to add a gram of Tylenol 3 times a day.consider nasal saline washes  Return if symptoms worsen or fail to improve.

## 2014-09-01 MED ORDER — SULFAMETHOXAZOLE-TRIMETHOPRIM 800-160 MG PO TABS: ORAL_TABLET | ORAL | Status: AC

## 2014-09-01 MED ORDER — AMOXICILLIN 500 MG PO CAPS: 500.0000 mg | ORAL_CAPSULE | Freq: Three times a day (TID) | ORAL | Status: DC

## 2014-09-01 NOTE — Telephone Encounter (Signed)
I called the pharmacy and they state the doxycycline is 25 dollars and the Amoxicillin is 3 dollars. The pharmacist did suggest Septra for 5 dollars.

## 2014-09-01 NOTE — Telephone Encounter (Signed)
Amoxicillin should be more affordable but is less likely to be effective, if he can not afford doxycycline I've sent a Rx of amoxicillin to his [facility] pharmacy.

## 2014-09-01 NOTE — Telephone Encounter (Signed)
Message left on vm 

## 2014-09-01 NOTE — Telephone Encounter (Signed)
I've sent a message to his pharmacy to discontiue amoxicillin and doxycycline, Rx for septra sent in.

## 2014-10-03 IMAGING — CT CT ABD-PELV W/ CM
2 of 4 series · 16 of 46 positions shown, 18 images · IV contrast (APPLIED)
Comparison: None.

CLINICAL DATA: Right lower quadrant pain with nausea and diarrhea.
Some left lower quadrant pain although not as dense.  Question
appendicitis?

CT ABDOMEN AND PELVIS WITH CONTRAST
TECHNIQUE: Multidetector CT imaging of the abdomen and pelvis was
performed following the standard protocol during bolus
administration of intravenous contrast.
Contrast: 100mL OMNIPAQUE IOHEXOL 300 MG/ML  SOLN

[Series 2: abd/pelvis 5.0 b31f · axial · 0.71mm/px · z∈[+838,+1288]mm · 13 of 100 slices shown, 15 images]
[im 5/100  soft-tissue]
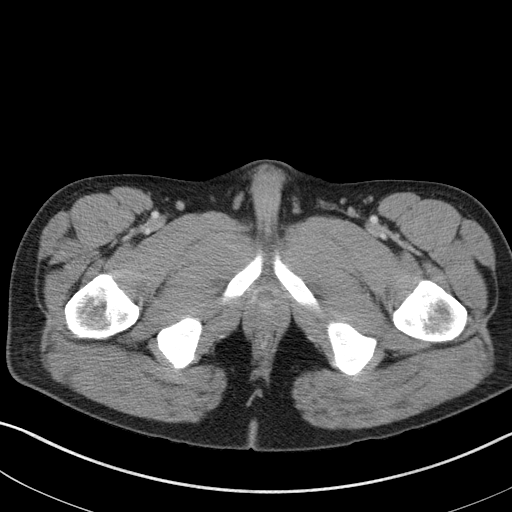
[im 5/100  bone]
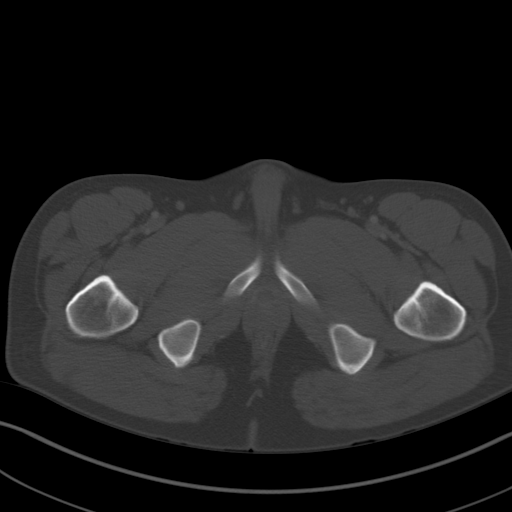
[im 13/100  soft-tissue]
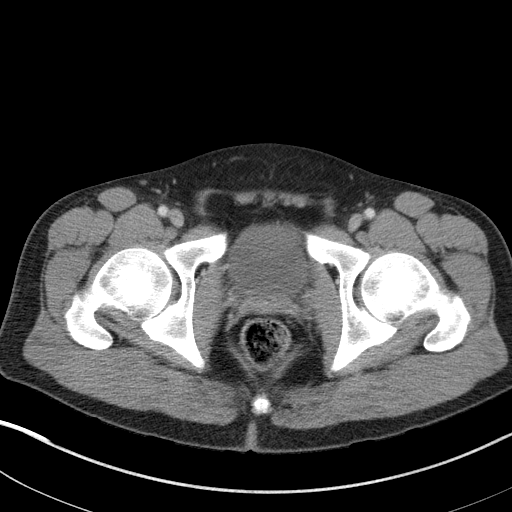
[im 22/100  soft-tissue]
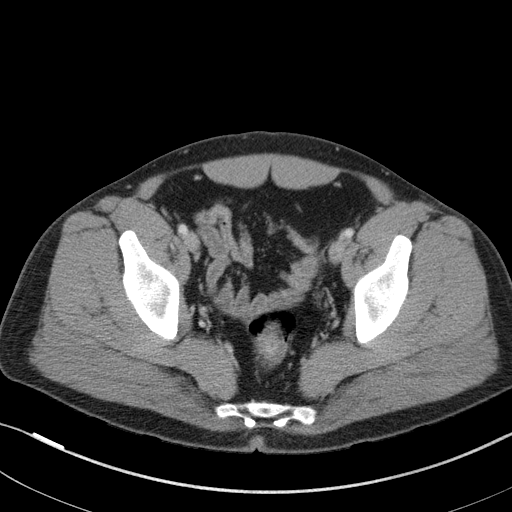
[im 26/100  soft-tissue]
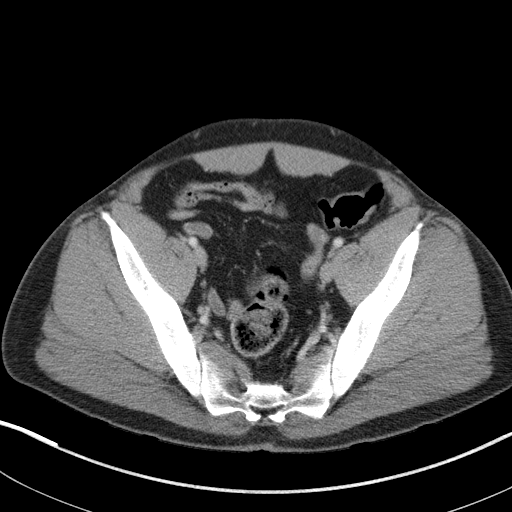
[im 35/100  soft-tissue]
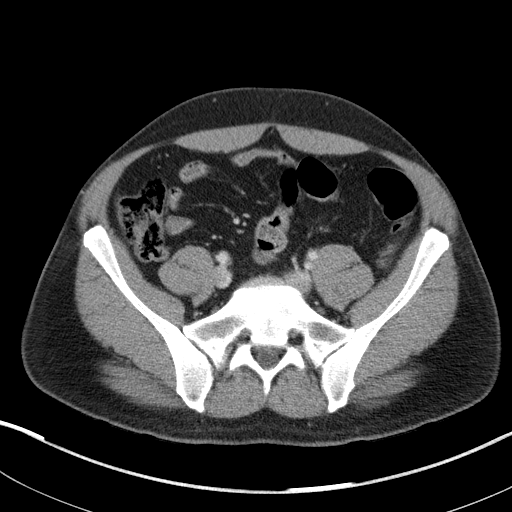
[im 44/100  soft-tissue]
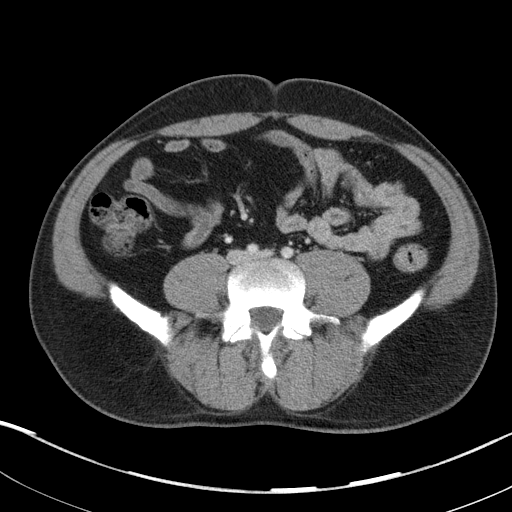
[im 52/100  soft-tissue]
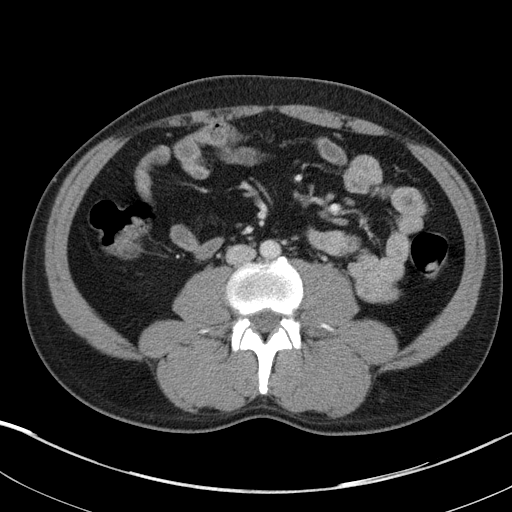
[im 56/100  soft-tissue]
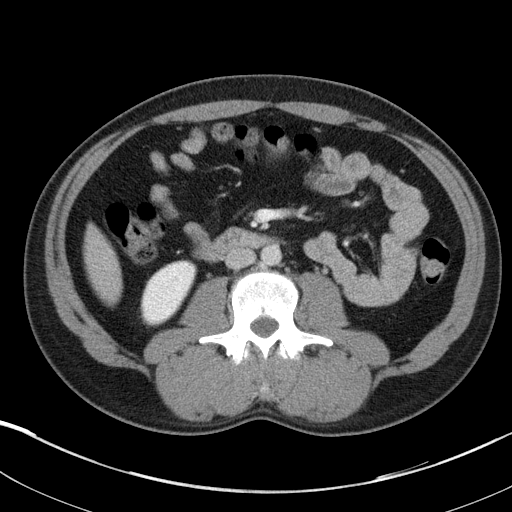
[im 65/100  soft-tissue]
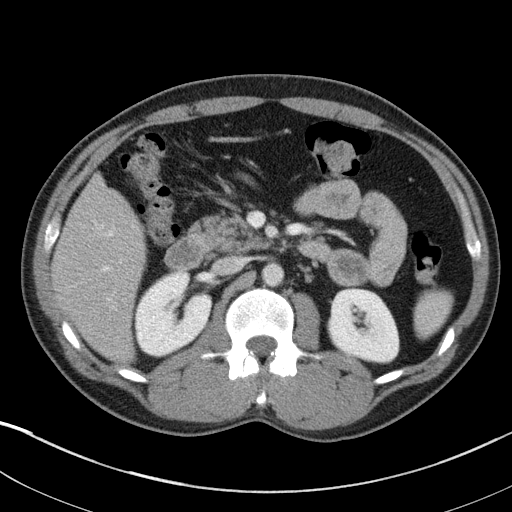
[im 65/100  bone]
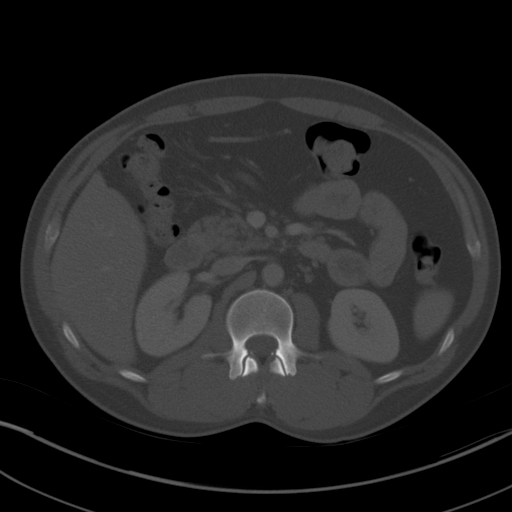
[im 74/100  soft-tissue]
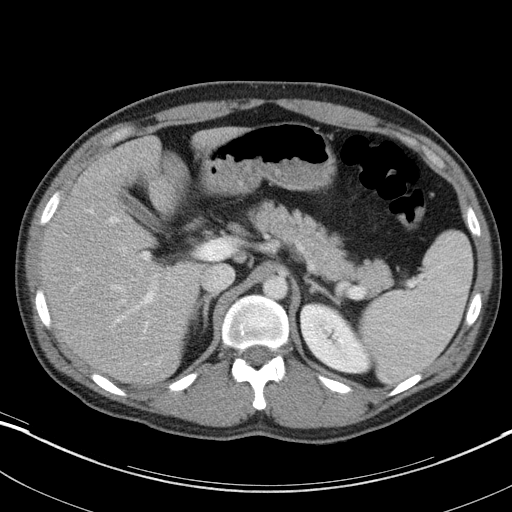
[im 78/100  soft-tissue]
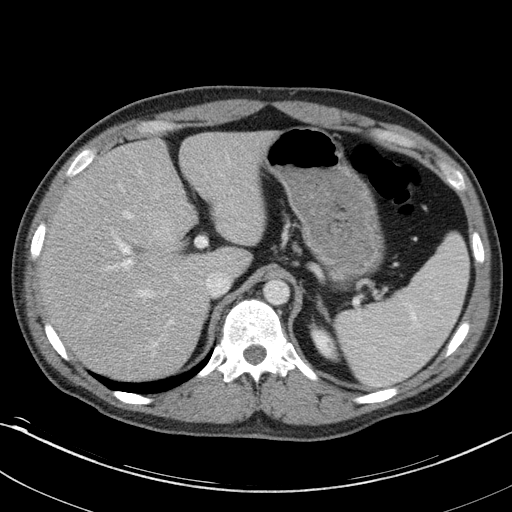
[im 87/100  soft-tissue]
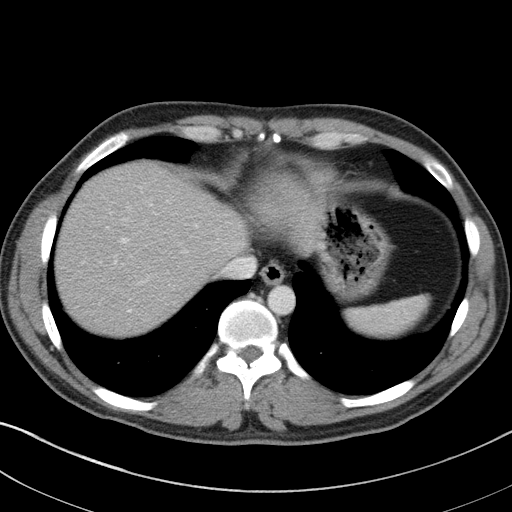
[im 95/100  soft-tissue]
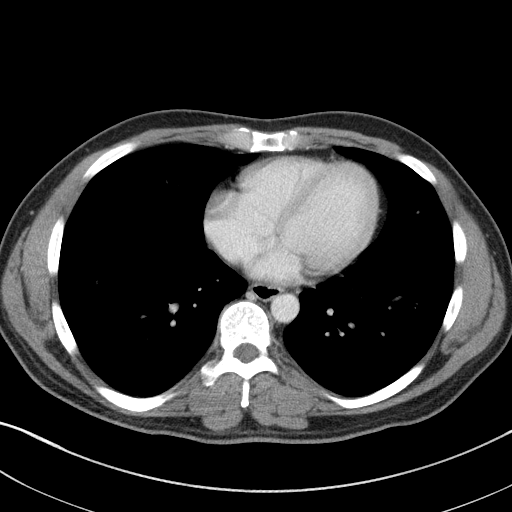

[Series 5: abd/pelvis 3.0 coronal · coronal · 0.71mm/px · 3 of 90 slices shown]
[im 30/90  soft-tissue]
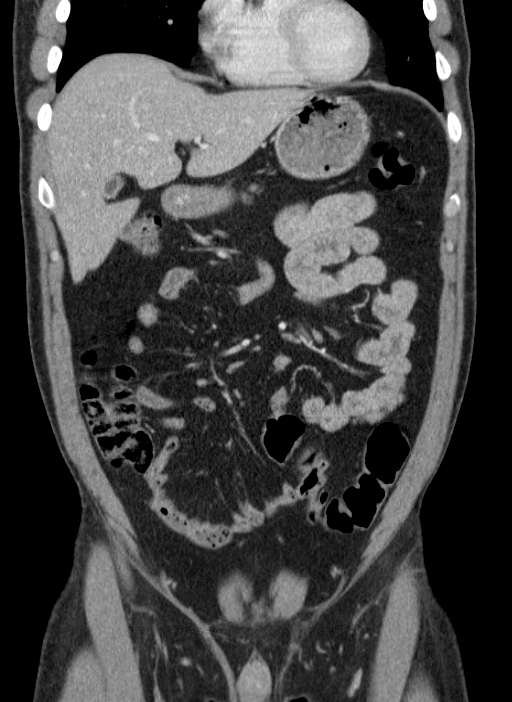
[im 40/90  soft-tissue]
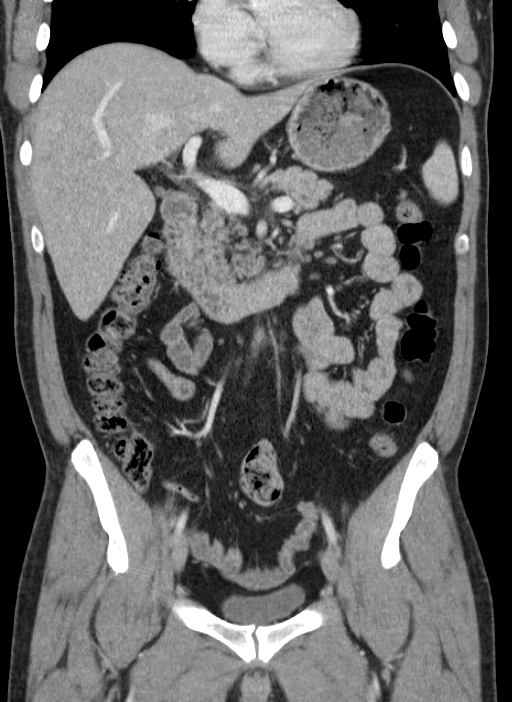
[im 50/90  soft-tissue]
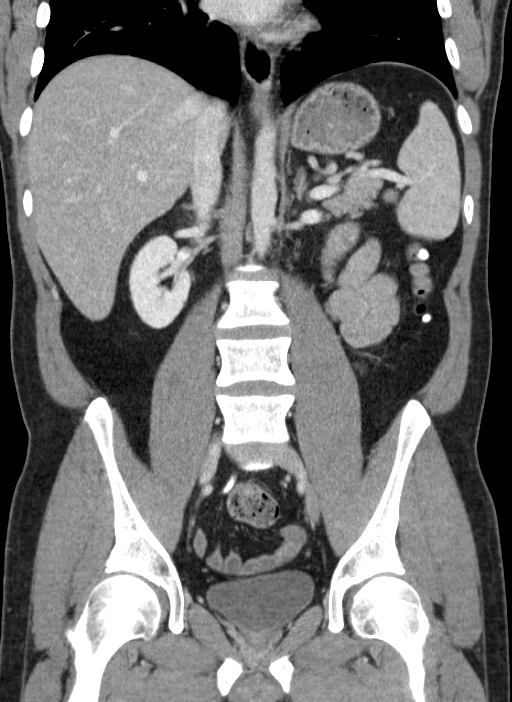

[16 of 46 positions shown; findings below may reference images not displayed]

FINDINGS: Radiopaque material within the appendix may reflect
ingest material versus small appendicoliths.  No evidence of
inflammation of the appendix.

Status small number of diverticula predominate left colon without
extraluminal bowel inflammatory process, free fluid or free air.

Mild fatty infiltration liver without focal mass.

Contracted gallbladder without calcified gallstones.  Spleen is at
the upper limits of normal in size spanning over 12.8 cm length
without focal mass.

No focal pancreatic lesion or adrenal lesion.

1 cm inferior pole right renal cyst.  Tiny low density structures
within both kidneys too small to adequately characterize
statistically likely cysts.  Tiny right angio myolipoma not
entirely excluded.

Lung bases clear.  No abdominal aortic aneurysm.

Top normal sized left external iliac lymph node without adenopathy
noted.

No bony destructive lesion.
IMPRESSION: No extraluminal bowel inflammatory process, free fluid or free air.
Specifically, no evidence to suggest appendicitis.  Appendicoliths
may be present. Scattered small number of colonic diverticula.

Spleen top normal in size.

Mild fatty infiltration liver.

Right renal cyst.  Tiny low density lesions in both kidneys too
small to adequately characterize statistically likely small cysts.

## 2014-12-03 ENCOUNTER — Encounter: Payer: Self-pay | Admitting: Family Medicine

## 2014-12-03 ENCOUNTER — Ambulatory Visit (INDEPENDENT_AMBULATORY_CARE_PROVIDER_SITE_OTHER): Payer: BLUE CROSS/BLUE SHIELD | Admitting: Family Medicine

## 2014-12-03 VITALS — BP 117/68 | HR 98 | Wt 170.0 lb

## 2014-12-03 DIAGNOSIS — R062 Wheezing: Secondary | ICD-10-CM

## 2014-12-03 DIAGNOSIS — R748 Abnormal levels of other serum enzymes: Secondary | ICD-10-CM

## 2014-12-03 DIAGNOSIS — R3129 Other microscopic hematuria: Secondary | ICD-10-CM

## 2014-12-03 DIAGNOSIS — Z01818 Encounter for other preprocedural examination: Secondary | ICD-10-CM | POA: Diagnosis not present

## 2014-12-03 DIAGNOSIS — R312 Other microscopic hematuria: Secondary | ICD-10-CM | POA: Diagnosis not present

## 2014-12-03 MED ORDER — HYDROCODONE-ACETAMINOPHEN 10-325 MG PO TABS
1.0000 | ORAL_TABLET | Freq: Four times a day (QID) | ORAL | Status: DC | PRN
Start: 2014-12-03 — End: 2015-02-16

## 2014-12-03 MED ORDER — ALBUTEROL SULFATE HFA 108 (90 BASE) MCG/ACT IN AERS: INHALATION_SPRAY | RESPIRATORY_TRACT | Status: DC

## 2014-12-03 NOTE — Progress Notes (Signed)
CC: Jose Roth is a 39 y.o. male is here for surgical clearance   Subjective: HPI:    Left hip surgery scheduled May 4th with Dr. Caswell Corwin.  WBC/Plt/Hgb normal 11/10/2014.  Has been sent here for surgical clearance. His only complaint is that he is requesting enough Norco to last him until his surgery. He's been moving heavy objects at work which causes him to break a sweat without causing any chest pain or shortness of breath. He denies leg claudication. Denies shortness of breath cough or any pulmonary complaints. He has quit smoking.    Review of Systems - General ROS: negative for - chills, fever, night sweats, weight gain or weight loss Ophthalmic ROS: negative for - decreased vision Psychological ROS: negative for - anxiety or depression ENT ROS: negative for - hearing change, nasal congestion, tinnitus or allergies Hematological and Lymphatic ROS: negative for - bleeding problems, bruising or swollen lymph nodes Breast ROS: negative Respiratory ROS: no cough, shortness of breath, or wheezing Cardiovascular ROS: no chest pain or dyspnea on exertion Gastrointestinal ROS: no abdominal pain, change in bowel habits, or black or bloody stools Genito-Urinary ROS: negative for - genital discharge, genital ulcers, incontinence or abnormal bleeding from genitals Musculoskeletal ROS: negative for - joint pain or muscle pain Neurological ROS: negative for - headaches or memory loss Dermatological ROS: negative for lumps, mole changes, rash and skin lesion changes  Past Medical History  Diagnosis Date  . High triglycerides   . Hypertriglyceridemia 07/11/2012  . Gastritis     Past Surgical History  Procedure Laterality Date  . Tonsillectomy    . Cyst removed  2013    from back of head  . Esophagogastroduodenoscopy  09/04/2012    Procedure: ESOPHAGOGASTRODUODENOSCOPY (EGD);  Surgeon: Beverley Fiedler, MD;  Location: [facility];  Service: Gastroenterology;  Laterality: N/A;   Family  History  Problem Relation Age of Onset  . Rheum arthritis Mother   . COPD Father   . Colon cancer Neg Hx   . Colon polyps Father     History   Social History  . Marital Status: Married    Spouse Name: N/A  . Number of Children: N/A  . Years of Education: N/A   Occupational History  . Not on file.   Social History Main Topics  . Smoking status: Current Every Day Smoker -- 0.50 packs/day for 16 years    Types: Cigarettes  . Smokeless tobacco: Never Used     Comment: form given 08-19-12.  [name] Alcohol Use: Yes     Comment: social   . Drug Use: No  . Sexual Activity: Not on file   Other Topics Concern  . Not on file   Social History Narrative     Objective: BP 117/68 mmHg  Pulse 98  Wt 170 lb (77.111 kg)  General: Alert and Oriented, No Acute Distress HEENT: Pupils equal, round, reactive to light. Conjunctivae clear.  Moist mucous membranes pharynx unremarkable Lungs: Comfortable work of breathing with an expiratory wheezing in all lung fields trace in severity. No rhonchi nor rales. No signs of consolidation Cardiac: Regular rate and rhythm. Normal S1/S2.  No murmurs, rubs, nor gallops.   Abdomen: Soft nontender Extremities: No peripheral edema.  Strong peripheral pulses.  Mental Status: No depression, anxiety, nor agitation. Skin: Warm and dry.  Assessment & Plan: Jose Roth was seen today for surgical clearance.  Diagnoses and all orders for this visit:  Preoperative examination Orders: -     COMPLETE  METABOLIC PANEL WITH GFR  Elevated liver enzymes (Jan 2014) Orders: -     COMPLETE METABOLIC PANEL WITH GFR  Microscopic hematuria Orders: -     COMPLETE METABOLIC PANEL WITH GFR  Wheezing Orders: -     albuterol (PROVENTIL HFA;VENTOLIN HFA) 108 (90 BASE) MCG/ACT inhaler; Inhale two puffs every 4-6 hours only as needed for shortness of breath or wheezing.  Other orders -     HYDROcodone-acetaminophen (NORCO) 10-325 MG per tablet; Take 1 tablet by mouth every  6 (six) hours as needed.   EKG shows normal sinus rhythm with normal axis. Normal intervals. No pathologic Q waves. No ST segment elevation or depression.  For his wheezing have encouraged him to start using albuterol. I do not suspect that this is due to any infectious etiology but rather reflective of chronic changes from smoking not severe enough to be a contraindication to anesthesia. Norco provided until his surgery date. Provided a metabolic panel is not significantly abnormal I see no contraindication to proceed with surgery.    Return if symptoms worsen or fail to improve.

## 2014-12-04 LAB — COMPLETE METABOLIC PANEL WITH GFR
ALT: 14 U/L (ref 0–53)
AST: 17 U/L (ref 0–37)
Albumin: 4.4 g/dL (ref 3.5–5.2)
Alkaline Phosphatase: 48 U/L (ref 39–117)
BUN: 12 mg/dL (ref 6–23)
CALCIUM: 9.1 mg/dL (ref 8.4–10.5)
CHLORIDE: 101 meq/L (ref 96–112)
CO2: 24 meq/L (ref 19–32)
CREATININE: 1.03 mg/dL (ref 0.50–1.35)
GFR, Est Non African American: >89 mL/min
GLUCOSE: 84 mg/dL (ref 70–99)
Potassium: 3.8 mEq/L (ref 3.5–5.3)
SODIUM: 135 meq/L (ref 135–145)
TOTAL PROTEIN: 6.8 g/dL (ref 6.0–8.3)
Total Bilirubin: 0.3 mg/dL (ref 0.2–1.2)

## 2014-12-06 ENCOUNTER — Telehealth: Payer: Self-pay | Admitting: Family Medicine

## 2014-12-06 NOTE — Telephone Encounter (Signed)
Sue Lushndrea, Will you please let patient know that his blood tests were normal, can you also please fax off the note in your inbox to Dr. Caswell CorwinStubbs at [phone]?

## 2014-12-06 NOTE — Telephone Encounter (Signed)
Letter faxed and left message on vm

## 2014-12-09 NOTE — Addendum Note (Signed)
Addended by: Chalmers CaterUTTLE, Teri Diltz H on: 12/09/2014 11:03 AM   Modules accepted: Orders

## 2015-02-14 ENCOUNTER — Ambulatory Visit: Payer: BLUE CROSS/BLUE SHIELD | Admitting: Family Medicine

## 2015-02-16 ENCOUNTER — Ambulatory Visit (INDEPENDENT_AMBULATORY_CARE_PROVIDER_SITE_OTHER): Payer: BLUE CROSS/BLUE SHIELD | Admitting: Family Medicine

## 2015-02-16 ENCOUNTER — Encounter: Payer: Self-pay | Admitting: Family Medicine

## 2015-02-16 VITALS — BP 131/88 | HR 104 | Wt 174.0 lb

## 2015-02-16 DIAGNOSIS — R609 Edema, unspecified: Secondary | ICD-10-CM | POA: Diagnosis not present

## 2015-02-16 DIAGNOSIS — F17200 Nicotine dependence, unspecified, uncomplicated: Secondary | ICD-10-CM

## 2015-02-16 MED ORDER — VARENICLINE TARTRATE 0.5 MG X 11 & 1 MG X 42 PO MISC: ORAL | Status: DC

## 2015-02-16 MED ORDER — VARENICLINE TARTRATE 1 MG PO TABS: 1.0000 mg | ORAL_TABLET | Freq: Two times a day (BID) | ORAL | Status: DC

## 2015-02-16 MED ORDER — FUROSEMIDE 20 MG PO TABS
20.0000 mg | ORAL_TABLET | Freq: Two times a day (BID) | ORAL | Status: DC | PRN
Start: 2015-02-16 — End: 2016-02-14

## 2015-02-16 NOTE — Progress Notes (Signed)
CC: Jose Roth is a 39 y.o. male is here for f/u edema   Subjective: HPI:  On Saturday he noticed that he was beginning to develop some swelling around both ankles. Symptoms while in severity however by Sunday they become moderate to severe. There are slightly improved with elevating the ankles. Late Sunday night they became painful and feeling like stretching and pins and needles. The swelling was always symmetric without any discoloration. He was unable to put his shoes on early Monday morning and went to a local emergency room where a d-dimer was negative and blood work was overall unremarkable with a EKG showing only sinus tachycardia. He was given a liter of fluid with no other interventions. He was advised to reduce his salt intake. He knows of no change in his salt intake prior to this episode. Over the past 24 hours it has subsided to the point where it's no longer existent in his mind. He denies any irregular heartbeat, chest pain, shortness of breath, orthopnea or peripheral edema elsewhere. This is only happened one other time a few years ago.  He would like to know if he can get help quitting smoking, the nicotine patches and gum approach has been ineffective in the past.   Review Of Systems Outlined In HPI  Past Medical History  Diagnosis Date  . High triglycerides   . Hypertriglyceridemia 07/11/2012  . Gastritis     Past Surgical History  Procedure Laterality Date  . Tonsillectomy    . Cyst removed  2013    from back of head  . Esophagogastroduodenoscopy  09/04/2012    Procedure: ESOPHAGOGASTRODUODENOSCOPY (EGD);  Surgeon: Beverley Fiedler, MD;  Location: [facility];  Service: Gastroenterology;  Laterality: N/A;   Family History  Problem Relation Age of Onset  . Rheum arthritis Mother   . COPD Father   . Colon cancer Neg Hx   . Colon polyps Father     History   Social History  . Marital Status: Married    Spouse Name: N/A  . Number of Children: N/A  . Years of  Education: N/A   Occupational History  . Not on file.   Social History Main Topics  . Smoking status: Current Every Day Smoker -- 0.50 packs/day for 16 years    Types: Cigarettes  . Smokeless tobacco: Never Used     Comment: form given 08-19-12.  [name] Alcohol Use: Yes     Comment: social   . Drug Use: No  . Sexual Activity: Not on file   Other Topics Concern  . Not on file   Social History Narrative     Objective: BP 131/88 mmHg  Pulse 104  Wt 174 lb (78.926 kg)  General: Alert and Oriented, No Acute Distress HEENT: Pupils equal, round, reactive to light. Conjunctivae clear.  Moist mucous membranes Lungs: Clear to auscultation bilaterally, no wheezing/ronchi/rales.  Comfortable work of breathing. Good air movement. Cardiac: Regular rate and rhythm. Normal S1/S2.  No murmurs, rubs, nor gallops.   Abdomen: Normal bowel sounds, soft and non tender without palpable masses. Extremities: No peripheral edema.  Strong peripheral pulses.  Mental Status: No depression, anxiety, nor agitation. Skin: Warm and dry.  Assessment & Plan: Jose Roth was seen today for f/u edema.  Diagnoses and all orders for this visit:  Tobacco dependence  Edema  Other orders -     furosemide (LASIX) 20 MG tablet; Take 1 tablet (20 mg total) by mouth 2 (two) times daily as needed (  swelling). -     varenicline (CHANTIX CONTINUING MONTH PAK) 1 MG tablet; Take 1 tablet (1 mg total) by mouth 2 (two) times daily. -     varenicline (CHANTIX STARTING MONTH PAK) 0.5 MG X 11 & 1 MG X 42 tablet; Take one 0.5 mg tablet by mouth once daily for 3 days, then increase to one 0.5 mg tablet twice daily for 4 days, then increase to one 1 mg tablet twice daily.  Tobacco dependence: Discussed risk and benefits of Chantix, he would like to go through with starting Chantix 1 week prior to a quit date. Edema: Absent now, agree that this was likely due to inactivity and also sodium intake. Discussed minimizing sodium intake as  much as possible and also using furosemide only on an as-needed basis  25 minutes spent face-to-face during visit today of which at least 50% was counseling or coordinating care regarding: 1. Tobacco dependence   2. Edema    and review of emergency room records and the presence of the patient   Return if symptoms worsen or fail to improve.

## 2016-02-14 ENCOUNTER — Encounter: Payer: Self-pay | Admitting: Family Medicine

## 2016-02-14 ENCOUNTER — Ambulatory Visit (INDEPENDENT_AMBULATORY_CARE_PROVIDER_SITE_OTHER): Payer: Self-pay | Admitting: Family Medicine

## 2016-02-14 VITALS — BP 108/74 | HR 130 | Wt 169.0 lb

## 2016-02-14 DIAGNOSIS — M25552 Pain in left hip: Secondary | ICD-10-CM

## 2016-02-14 MED ORDER — HYDROCODONE-ACETAMINOPHEN 10-325 MG PO TABS: 0.5000 | ORAL_TABLET | Freq: Three times a day (TID) | ORAL | Status: DC | PRN

## 2016-02-14 NOTE — Progress Notes (Signed)
CC: Jose Roth is a 40 y.o. male is here for Hip Pain   Subjective: HPI:  2 weeks ago he was walking comfortably and felt a sudden pop and snap in his left hip. He had immediate pain that radiates from the left groin into the lateral buttock. It's worse with internal and external rotation or bearing any weight. Actually also gets worse when he sitting for longer than 20 minutes. No benefit from over-the-counter anti-inflammatories. He was seen last Wednesday and had an x-ray which was unremarkable. He tells me the pain is not getting any better or worse and is currently moderate to severe in severity. He denies any genitourinary complaints or gastrointestinal complaints. No motor or sensory disturbance in his lower extremities other than the pain. He denies any skin changes   Review Of Systems Outlined In HPI  Past Medical History  Diagnosis Date  . High triglycerides   . Hypertriglyceridemia 07/11/2012  . Gastritis     Past Surgical History  Procedure Laterality Date  . Tonsillectomy    . Cyst removed  2013    from back of head  . Esophagogastroduodenoscopy  09/04/2012    Procedure: ESOPHAGOGASTRODUODENOSCOPY (EGD);  Surgeon: Beverley FiedlerJay M Pyrtle, MD;  Location: [facility];  Service: Gastroenterology;  Laterality: N/A;   Family History  Problem Relation Age of Onset  . Rheum arthritis Mother   . COPD Father   . Colon cancer Neg Hx   . Colon polyps Father     Social History   Social History  . Marital Status: Married    Spouse Name: N/A  . Number of Children: N/A  . Years of Education: N/A   Occupational History  . Not on file.   Social History Main Topics  . Smoking status: Current Every Day Smoker -- 0.50 packs/day for 16 years    Types: Cigarettes  . Smokeless tobacco: Never Used     Comment: form given 08-19-12.  [name]. Alcohol Use: Yes     Comment: social   . Drug Use: No  . Sexual Activity: Not on file   Other Topics Concern  . Not on file   Social History  Narrative     Objective: BP 108/74 mmHg  Pulse 130  Wt 169 lb (76.658 kg)  General: Alert and Oriented, No Acute Distress HEENT: Pupils equal, round, reactive to light. Conjunctivae clear.   Lungs: Clear to auscultation bilaterally, no wheezing/ronchi/rales.  Comfortable work of breathing. Good air movement. Cardiac: Regular rate and rhythm. Normal S1/S2.  No murmurs, rubs, nor gallops.   Extremities: No peripheral edema.  Strong peripheral pulses.  Left leg exam: Pain is reproduced with palpation over the left greater trochanter which causes immediate groin pain. Pain is reproduced with 20 of either internal or external rotation from the midline. Patient is unable to raise leg above table on his own without pain Mental Status: No depression, anxiety, nor agitation. Skin: Warm and dry.  Assessment & Plan: Jose NeedleMichael was seen today for hip pain.  Diagnoses and all orders for this visit:  Left hip pain  Other orders -     HYDROcodone-acetaminophen (NORCO) 10-325 MG tablet; Take 0.5-1 tablets by mouth every 8 (eight) hours as needed. Jose Roth is to keep control of this.   Recommend that he had an MRI to determine if he is injured his labrum again and if he needs to go back and see Jose Roth for possible hip replacement. He is unable to afford this right now  and is waiting for his Medicaid to kick in. He's had trouble managing his own opiates in the past and his wife is willing to hold onto the bottle and keep complete control over in order to make sure that he is only dosing this as written. He is in agreement to this. Encouraged him to decrease weightbearing with crutches, he is going to look at a local thrift store before buying new ones out-of-pocket here.  Return if symptoms worsen or fail to improve.

## 2016-08-17 ENCOUNTER — Ambulatory Visit: Payer: BLUE CROSS/BLUE SHIELD | Admitting: Physician Assistant

## 2016-08-24 ENCOUNTER — Ambulatory Visit: Payer: BLUE CROSS/BLUE SHIELD | Admitting: Physician Assistant

## 2016-08-29 ENCOUNTER — Ambulatory Visit: Payer: BLUE CROSS/BLUE SHIELD | Admitting: Physician Assistant

## 2016-09-05 ENCOUNTER — Encounter: Payer: Self-pay | Admitting: Physician Assistant

## 2016-09-05 ENCOUNTER — Ambulatory Visit (INDEPENDENT_AMBULATORY_CARE_PROVIDER_SITE_OTHER): Payer: BLUE CROSS/BLUE SHIELD | Admitting: Physician Assistant

## 2016-09-05 ENCOUNTER — Encounter: Payer: Self-pay | Admitting: Family Medicine

## 2016-09-05 ENCOUNTER — Ambulatory Visit (INDEPENDENT_AMBULATORY_CARE_PROVIDER_SITE_OTHER): Payer: BLUE CROSS/BLUE SHIELD | Admitting: Family Medicine

## 2016-09-05 VITALS — BP 127/90 | HR 112 | Wt 170.0 lb

## 2016-09-05 VITALS — BP 127/90 | HR 112 | Ht 69.0 in | Wt 170.0 lb

## 2016-09-05 DIAGNOSIS — M25552 Pain in left hip: Secondary | ICD-10-CM | POA: Diagnosis not present

## 2016-09-05 DIAGNOSIS — F4323 Adjustment disorder with mixed anxiety and depressed mood: Secondary | ICD-10-CM | POA: Diagnosis not present

## 2016-09-05 DIAGNOSIS — Z639 Problem related to primary support group, unspecified: Secondary | ICD-10-CM | POA: Diagnosis not present

## 2016-09-05 DIAGNOSIS — Z638 Other specified problems related to primary support group: Secondary | ICD-10-CM

## 2016-09-05 MED ORDER — HYDROCODONE-ACETAMINOPHEN 5-325 MG PO TABS
1.0000 | ORAL_TABLET | Freq: Four times a day (QID) | ORAL | 0 refills | Status: DC | PRN
Start: 2016-09-05 — End: 2016-11-06

## 2016-09-05 MED ORDER — FLUOXETINE HCL 20 MG PO TABS: 20.0000 mg | ORAL_TABLET | Freq: Every day | ORAL | 1 refills | Status: DC

## 2016-09-05 NOTE — Progress Notes (Signed)
Jose Roth is a 41 y.o. male who presents to [facility] today for left hip pain. 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 He had what sounds like a hip scope at [facility] quite a while ago. He was doing reasonably well until about a month ago when he felt a pop while turning over in bed. He developed immediate pain in the left anterior groin. He called his orthopedic surgeon and was seen by a PA and that practice on January 9th where he was scheduled for a MRI arthrogram of the left hip and prescribed 20 tablets of Norco. He notes that the MRI is scheduled for next week however he does have considerable pain. He notes inability to walk normally and notes that the typical pain medications that he's been taking are not helpful. He's tried meloxicam as well as over-the-counter pain medications which have not helped at all for this. Additionally the past she's been prescribed Valium for muscle spasms after a hip scope which she notes didn't help much at all.  His past medical history is concerning for history of substance abuse. He notes that he is been clean for several years.   Past Medical History:  Diagnosis Date  . Gastritis   . High triglycerides   . Hypertriglyceridemia 07/11/2012   Past Surgical History:  Procedure Laterality Date  . cyst removed  2013   from back of head  . ESOPHAGOGASTRODUODENOSCOPY  09/04/2012   Procedure: ESOPHAGOGASTRODUODENOSCOPY (EGD);  Surgeon: Beverley Fiedler, MD;  Location: [facility];  Service: Gastroenterology;  Laterality: N/A;  . TONSILLECTOMY     Social History  Substance Use Topics  . Smoking status: Current Every Day Smoker    Packs/day: 0.50    Years: 16.00    Types: Cigarettes  . Smokeless tobacco: Never Used     Comment: form given 08-19-12.  [name] Alcohol use Yes     Comment: social      ROS:  As above   Medications: Current Outpatient  Prescriptions  Medication Sig Dispense Refill  . meloxicam (MOBIC) 15 MG tablet Take 15 mg by mouth.    [name] HYDROcodone-acetaminophen (NORCO/VICODIN) 5-325 MG tablet Take 1 tablet by mouth every 6 (six) hours as needed. 20 tablet 0   No current facility-administered medications for this visit.    Allergies  Allergen Reactions  . Lyrica [Pregabalin] Other (See Comments)    Severe body cramps  . Morphine And Related Itching  . Tramadol Itching     Exam:  BP 127/90   Pulse (!) 112   Wt 170 lb (77.1 kg)   BMI 25.10 kg/m  General: Well Developed, well nourished, and in no acute distress.   MSK: Left hip normal-appearing tender palpation anterior hip. Significant pain with motion. Strength not tested due to patient discomfort. Patient guards with exam. No erythema is noted. Gait is antalgic.     No results found for this or any previous visit (from the past 48 hour(s)). No results found.    Assessment and Plan: 41 y.o. male with left anterior hip pain E labrum tear or some other intra-articular hip pathology. Review of x-ray report from [facility] shows no significant abnormality on hip x-ray.  I think the diagnostic plan is pretty clear here. It is reasonable to proceed with the hip MRI arthrogram is already scheduled next week. I don't think there is any other serious etiology likely such  as septic arthritis. He's afebrile and aside from being painful looks nontoxic. I think the main goal today is to try to control pain a little bit better. History of substance abuse is obviously concerning. I researched the [place] in the [place] controlled substance reporting system and aside from a prescription by the [facility] PA on January 9 he's not had any controlled substances prescribed in [place] since his previous primary care provider at this office last prescribed in July 2017.  I discussed the case with the patient's orthopedic surgeon Dr. Caswell CorwinStubbs we will try to get  the patient a sooner appointment. Plan to prescribe a very limited amount of Norco. We had a frank discussion that continued Norco prescriptions will not be provided by me.   Patient has a follow-up appointment with his primary care provider today in this office.    No orders of the defined types were placed in this encounter.   Discussed warning signs or symptoms. Please see discharge instructions. Patient expresses understanding.  I spent 40 minutes with this patient, greater than 50% was face-to-face time counseling regarding the above diagnosis.

## 2016-09-05 NOTE — Progress Notes (Signed)
   Subjective:    Patient ID: Jose MoronMichael L Roth, male    DOB: 1976-02-02, 41 y.o.   MRN: [id number]  HPI  Pt is a 41 yo male who presents to the clinic to establish care after Dr. Ivan AnchorsHommel left clinic. He comes in to talk about his mood. He just feels like he needs something to help him. He has delt with depression for a while. He continues to work and do things that make him happy but "life is wearing on him". His ex wife is not letting him see his 2 kids. His brother and mother are not really speaking to him. His dad died 4 years ago and he was his primary caregiver for 4 years. He denies any suicidal or homicidal thoughts. He just wants to feel better. Not tried medication in the past.   He has some ongoing left hip pain being managed by othropedics and Dr. Denyse Amassorey. Hx of labral tear and need for repair.      Review of Systems  All other systems reviewed and are negative.      Objective:   Physical Exam  Constitutional: He is oriented to person, place, and time. He appears well-developed and well-nourished.  HENT:  Head: Normocephalic and atraumatic.  Cardiovascular: Normal rate, regular rhythm and normal heart sounds.   Pulmonary/Chest: Effort normal and breath sounds normal.  Neurological: He is alert and oriented to person, place, and time.  Psychiatric: His behavior is normal.  Flat affect.           Assessment & Plan:  [name].[name].[name] was seen today for depression and anxiety.  Diagnoses and all orders for this visit:  Adjustment disorder with mixed anxiety and depressed mood -     FLUoxetine (PROZAC) 20 MG tablet; Take 1 tablet (20 mg total) by mouth daily. -     Ambulatory referral to Psychology  Family discord -     FLUoxetine (PROZAC) 20 MG tablet; Take 1 tablet (20 mg total) by mouth daily. -     Ambulatory referral to Psychology  Left hip pain  PHQ-9 was 15. GAD-7 was 20.  Discussed side effects of medication. I do think patient would benefit from counseling.  Discussed how pain and depression make each other worse.  Follow up in 4-6 weeks.

## 2016-09-05 NOTE — Patient Instructions (Addendum)
Thank you for coming in today. Please follow up with your orthopedist.   I cannot prescribe you any further pain medications after this time.  Your PCP will have to see you in person to also prescribe any medications.   Use norco sparingly.

## 2016-09-10 ENCOUNTER — Telehealth: Payer: Self-pay

## 2016-09-10 NOTE — Telephone Encounter (Signed)
Call pt: what time of day are you taking it?

## 2016-09-10 NOTE — Telephone Encounter (Signed)
Pt's wife called and stated that [name] is not sleeping at all. Pt wants to know if Tandy GawJade Breeback, PA-C can increase dose or change to something else to help him sleep?

## 2016-09-11 ENCOUNTER — Other Ambulatory Visit: Payer: Self-pay

## 2016-09-11 MED ORDER — TRAZODONE HCL 50 MG PO TABS: 50.0000 mg | ORAL_TABLET | Freq: Every evening | ORAL | 1 refills | Status: DC | PRN

## 2016-09-11 NOTE — Telephone Encounter (Signed)
Rx sent to pharmacy per Tandy GawJade Breeback, PA-C. Pt advised.

## 2016-09-11 NOTE — Telephone Encounter (Signed)
Pt states he is taking it before bed.

## 2016-09-11 NOTE — Telephone Encounter (Signed)
Lets keep at same dose and add trazodone 50mg  one tablet at bedtime #30 1 refill.

## 2016-10-19 ENCOUNTER — Ambulatory Visit (HOSPITAL_COMMUNITY): Payer: BLUE CROSS/BLUE SHIELD | Admitting: Licensed Clinical Social Worker

## 2016-11-01 ENCOUNTER — Other Ambulatory Visit: Payer: Self-pay | Admitting: Physician Assistant

## 2016-11-01 ENCOUNTER — Telehealth: Payer: Self-pay

## 2016-11-01 DIAGNOSIS — Z638 Other specified problems related to primary support group: Secondary | ICD-10-CM

## 2016-11-01 DIAGNOSIS — F4323 Adjustment disorder with mixed anxiety and depressed mood: Secondary | ICD-10-CM

## 2016-11-01 MED ORDER — FLUOXETINE HCL 20 MG PO TABS: 20.0000 mg | ORAL_TABLET | Freq: Every day | ORAL | 1 refills | Status: DC

## 2016-11-01 MED ORDER — TRAZODONE HCL 50 MG PO TABS: 50.0000 mg | ORAL_TABLET | Freq: Every evening | ORAL | 1 refills | Status: DC | PRN

## 2016-11-01 NOTE — Telephone Encounter (Signed)
Notified patient and transferred to scheduling for appointment. 

## 2016-11-01 NOTE — Telephone Encounter (Signed)
I sent refill but patient does need a follow as this was a new start in January. Please make appt.

## 2016-11-01 NOTE — Telephone Encounter (Signed)
Pt requesting a refill on Prozac and trazodone.  Please advise.

## 2016-11-02 ENCOUNTER — Other Ambulatory Visit: Payer: Self-pay | Admitting: Physician Assistant

## 2016-11-06 ENCOUNTER — Encounter: Payer: Self-pay | Admitting: Physician Assistant

## 2016-11-06 ENCOUNTER — Other Ambulatory Visit: Payer: Self-pay

## 2016-11-06 ENCOUNTER — Ambulatory Visit (INDEPENDENT_AMBULATORY_CARE_PROVIDER_SITE_OTHER): Payer: Self-pay | Admitting: Physician Assistant

## 2016-11-06 VITALS — BP 102/66 | HR 82 | Ht 69.0 in | Wt 164.0 lb

## 2016-11-06 DIAGNOSIS — R0789 Other chest pain: Secondary | ICD-10-CM

## 2016-11-06 DIAGNOSIS — Z639 Problem related to primary support group, unspecified: Secondary | ICD-10-CM

## 2016-11-06 DIAGNOSIS — Z638 Other specified problems related to primary support group: Secondary | ICD-10-CM

## 2016-11-06 DIAGNOSIS — E781 Pure hyperglyceridemia: Secondary | ICD-10-CM

## 2016-11-06 DIAGNOSIS — F4323 Adjustment disorder with mixed anxiety and depressed mood: Secondary | ICD-10-CM

## 2016-11-06 DIAGNOSIS — R109 Unspecified abdominal pain: Secondary | ICD-10-CM

## 2016-11-06 DIAGNOSIS — M549 Dorsalgia, unspecified: Secondary | ICD-10-CM

## 2016-11-06 DIAGNOSIS — M79602 Pain in left arm: Secondary | ICD-10-CM

## 2016-11-06 DIAGNOSIS — R319 Hematuria, unspecified: Secondary | ICD-10-CM

## 2016-11-06 DIAGNOSIS — R1012 Left upper quadrant pain: Secondary | ICD-10-CM

## 2016-11-06 LAB — POCT URINALYSIS DIPSTICK
Bilirubin, UA: NEGATIVE
Glucose, UA: NEGATIVE
Ketones, UA: NEGATIVE
Leukocytes, UA: NEGATIVE
NITRITE UA: NEGATIVE
PROTEIN UA: NEGATIVE
SPEC GRAV UA: 1.03 (ref 1.030–1.035)
UROBILINOGEN UA: 0.2 (ref ?–2.0)
pH, UA: 6.5 (ref 5.0–8.0)

## 2016-11-06 LAB — CBC WITH DIFFERENTIAL/PLATELET
BASOS ABS: 0 {cells}/uL (ref 0–200)
Basophils Relative: 0 %
Eosinophils Absolute: 162 cells/uL (ref 15–500)
Eosinophils Relative: 2 %
HEMATOCRIT: 43.6 % (ref 38.5–50.0)
Hemoglobin: 14.4 g/dL (ref 13.2–17.1)
LYMPHS ABS: 1701 {cells}/uL (ref 850–3900)
LYMPHS PCT: 21 %
MCH: 29.4 pg (ref 27.0–33.0)
MCHC: 33 g/dL (ref 32.0–36.0)
MCV: 89 fL (ref 80.0–100.0)
MPV: 9.9 fL (ref 7.5–12.5)
Monocytes Absolute: 567 cells/uL (ref 200–950)
Monocytes Relative: 7 %
NEUTROS PCT: 70 %
Neutro Abs: 5670 cells/uL (ref 1500–7800)
Platelets: 224 10*3/uL (ref 140–400)
RBC: 4.9 MIL/uL (ref 4.20–5.80)
RDW: 13.5 % (ref 11.0–15.0)
WBC: 8.1 10*3/uL (ref 3.8–10.8)

## 2016-11-06 LAB — LIPID PANEL W/REFLEX DIRECT LDL
Cholesterol: 153 mg/dL (ref <200)
HDL: 33 mg/dL — ABNORMAL LOW (ref >40)
LDL-CHOLESTEROL: 99 mg/dL
NON-HDL CHOLESTEROL (CALC): 120 mg/dL (ref <130)
Total CHOL/HDL Ratio: 4.6 Ratio (ref <5.0)
Triglycerides: 114 mg/dL (ref <150)

## 2016-11-06 LAB — COMPLETE METABOLIC PANEL WITH GFR
ALBUMIN: 4.5 g/dL (ref 3.6–5.1)
ALK PHOS: 59 U/L (ref 40–115)
ALT: 17 U/L (ref 9–46)
AST: 16 U/L (ref 10–40)
BILIRUBIN TOTAL: 0.4 mg/dL (ref 0.2–1.2)
BUN: 8 mg/dL (ref 7–25)
CALCIUM: 9.2 mg/dL (ref 8.6–10.3)
CO2: 27 mmol/L (ref 20–31)
Chloride: 106 mmol/L (ref 98–110)
Creat: 0.96 mg/dL (ref 0.60–1.35)
Glucose, Bld: 99 mg/dL (ref 65–99)
POTASSIUM: 4.8 mmol/L (ref 3.5–5.3)
Sodium: 140 mmol/L (ref 135–146)
TOTAL PROTEIN: 7.1 g/dL (ref 6.1–8.1)

## 2016-11-06 LAB — AMYLASE: Amylase: 48 U/L (ref 0–105)

## 2016-11-06 LAB — LIPASE: LIPASE: 23 U/L (ref 7–60)

## 2016-11-06 MED ORDER — LORAZEPAM 0.5 MG PO TABS: 0.5000 mg | ORAL_TABLET | Freq: Three times a day (TID) | ORAL | 1 refills | Status: AC | PRN

## 2016-11-06 MED ORDER — OMEPRAZOLE 40 MG PO CPDR: 40.0000 mg | DELAYED_RELEASE_CAPSULE | Freq: Every day | ORAL | 3 refills | Status: AC

## 2016-11-06 MED ORDER — FLUOXETINE HCL 20 MG PO CAPS: 20.0000 mg | ORAL_CAPSULE | Freq: Every day | ORAL | 1 refills | Status: DC

## 2016-11-06 MED ORDER — KETOROLAC TROMETHAMINE 60 MG/2ML IM SOLN
60.0000 mg | Freq: Once | INTRAMUSCULAR | Status: AC
  Administered 2016-11-06: 60 mg via INTRAMUSCULAR

## 2016-11-06 MED ORDER — FLUOXETINE HCL 20 MG PO TABS: 20.0000 mg | ORAL_TABLET | Freq: Every day | ORAL | 1 refills | Status: DC

## 2016-11-06 NOTE — Progress Notes (Addendum)
Subjective:    Patient ID: Jose Roth, male    DOB: [birth year], 41 y.o.   MRN: [id number]  HPI  Pt is a 41 yo male who presents to the clinic for hospital follow up. Pt went to the ED on 11/02/16 for sudden left sided chest pain and left arm pain. On presentation appeared to have a lot of pain with palpatiion of left pectoralis muscles and behind shoulder blade.  CMP, Tropoinin, CBC, EKG unchanged, CXR normal. Low probability for cardaic etiology. He has a stress test scheduled for Friday. Started on ASA .  Pt continues to have "squeezing sensation around left chest". A few times BP has been checked and around 90/40's and blood sugar at times been 60 at lowest.  He denies any heavy lifting.occasional dysuria with UA having trace blood in ED but no other diagnostic factors. Hx of one kidney stone that he has passed. He is very nauseated and had some loose stools. Hx of elevated TG that he is not currently taking any medications or supplements for. No fever, chills, body aches.   He is out of his prozac. He has had a lot of bad events with family over the past week. His mother and brother do not want to talk/see each other again.     Review of Systems  All other systems reviewed and are negative.      Objective:   Physical Exam  Constitutional: He is oriented to person, place, and time. He appears well-developed and well-nourished.  HENT:  Head: Normocephalic and atraumatic.  Eyes: Conjunctivae are normal. Right eye exhibits no discharge. Left eye exhibits no discharge.  Neck: Normal range of motion. Neck supple.  Cardiovascular: Normal rate, regular rhythm and normal heart sounds.   Pulmonary/Chest: Effort normal and breath sounds normal. He has no wheezes.  Tenderness over left CVA and left chest wall to palpation.   Abdominal: Soft. Bowel sounds are normal.  Mild tenderness over LUQ and RLQ to palpation. No rebound or guarding.   Lymphadenopathy:    He has no cervical  adenopathy.  Neurological: He is alert and oriented to person, place, and time.  Psychiatric: He has a normal mood and affect. His behavior is normal.          Assessment & Plan:  Marland KitchenMarland KitchenDiagnoses and all orders for this visit:  Left-sided chest wall pain  Hypertriglyceridemia -     COMPLETE METABOLIC PANEL WITH GFR -     CBC with Differential/Platelet -     Lipid Panel w/reflex Direct LDL  LUQ pain -     COMPLETE METABOLIC PANEL WITH GFR -     CBC with Differential/Platelet -     H. pylori breath test  Pain radiating to back -     COMPLETE METABOLIC PANEL WITH GFR -     Amylase -     Lipase -     CBC with Differential/Platelet -     ketorolac (TORADOL) injection 60 mg; Inject 2 mLs (60 mg total) into the muscle once. -     CT RENAL STONE STUDY; Future -     CT RENAL STONE STUDY  Left flank pain -     POCT urinalysis dipstick -     CT RENAL STONE STUDY; Future -     CT RENAL STONE STUDY  Adjustment disorder with mixed anxiety and depressed mood -     Discontinue: FLUoxetine (PROZAC) 20 MG tablet; Take 1 tablet (20 mg total) by  mouth daily. -     LORazepam (ATIVAN) 0.5 MG tablet; Take 1 tablet (0.5 mg total) by mouth every 8 (eight) hours as needed for anxiety.  Family discord -     Discontinue: FLUoxetine (PROZAC) 20 MG tablet; Take 1 tablet (20 mg total) by mouth daily. -     LORazepam (ATIVAN) 0.5 MG tablet; Take 1 tablet (0.5 mg total) by mouth every 8 (eight) hours as needed for anxiety.  Pain in left arm  Hematuria, unspecified type -     CT RENAL STONE STUDY; Future -     CT RENAL STONE STUDY  Other orders -     omeprazole (PRILOSEC) 40 MG capsule; Take 1 capsule (40 mg total) by mouth daily.  hematuria in UA dipstick.   Unclear etiology. I do not believe this is cardiac.  Acute pancreatitis, h.pylori, PUD, acute stress response all on the differential.  Restarted prozac, he was doing really well before he ran out. Gave small quanity of ativan. Discussed  only use as needed. Abuse potential warned. Hopefully once prozac gets back in system will be feeling better.  Breath test done today in office.  Concerned with hx of TG elevation of some pancreatitis. Will check pancreas in labs.  Started on omeprazole daily to see if any symptoms are due to GERD.   Follow up in 3 weeks, sooner if needed. Encouraged to keep stress test appt.

## 2016-11-06 NOTE — Progress Notes (Signed)
Call pt: no signs of pancreatitis. Pancreatic enzymes look good.  Kidney, liver, glucose look good.  Cholesterol is good.  TG are great.  WBC normal.  There was some blood in urine we could do CT to look for kidney stones.

## 2016-11-06 NOTE — Patient Instructions (Signed)
Get labs will call with results.

## 2016-11-07 LAB — H. PYLORI BREATH TEST: H. pylori Breath Test: NOT DETECTED

## 2016-11-07 NOTE — Addendum Note (Signed)
Addended by: Jomarie Longs on: 11/07/2016 12:05 PM   Modules accepted: Orders

## 2016-11-07 NOTE — Progress Notes (Signed)
Ordered CT scan

## 2016-11-07 NOTE — Progress Notes (Signed)
Call pt: h.pylori negative. How are you feeling today?

## 2016-11-08 ENCOUNTER — Ambulatory Visit (INDEPENDENT_AMBULATORY_CARE_PROVIDER_SITE_OTHER): Payer: BLUE CROSS/BLUE SHIELD

## 2016-11-08 ENCOUNTER — Encounter (INDEPENDENT_AMBULATORY_CARE_PROVIDER_SITE_OTHER): Payer: Self-pay

## 2016-11-08 DIAGNOSIS — N281 Cyst of kidney, acquired: Secondary | ICD-10-CM

## 2016-11-09 ENCOUNTER — Other Ambulatory Visit: Payer: Self-pay

## 2016-11-09 ENCOUNTER — Ambulatory Visit (HOSPITAL_COMMUNITY): Payer: BLUE CROSS/BLUE SHIELD | Admitting: Licensed Clinical Social Worker

## 2016-11-23 ENCOUNTER — Encounter: Payer: Self-pay | Admitting: Physician Assistant

## 2016-11-23 ENCOUNTER — Ambulatory Visit (INDEPENDENT_AMBULATORY_CARE_PROVIDER_SITE_OTHER): Payer: BLUE CROSS/BLUE SHIELD | Admitting: Physician Assistant

## 2016-11-23 VITALS — BP 109/68 | HR 83 | Ht 69.0 in | Wt 168.0 lb

## 2016-11-23 DIAGNOSIS — F5103 Paradoxical insomnia: Secondary | ICD-10-CM | POA: Insufficient documentation

## 2016-11-23 DIAGNOSIS — F5102 Adjustment insomnia: Secondary | ICD-10-CM

## 2016-11-23 DIAGNOSIS — Z638 Other specified problems related to primary support group: Secondary | ICD-10-CM | POA: Diagnosis not present

## 2016-11-23 DIAGNOSIS — F4323 Adjustment disorder with mixed anxiety and depressed mood: Secondary | ICD-10-CM

## 2016-11-23 MED ORDER — FLUOXETINE HCL 40 MG PO CAPS: 40.0000 mg | ORAL_CAPSULE | Freq: Every day | ORAL | 5 refills | Status: AC

## 2016-11-23 MED ORDER — TRAZODONE HCL 50 MG PO TABS: 50.0000 mg | ORAL_TABLET | Freq: Every evening | ORAL | 4 refills | Status: AC | PRN

## 2016-11-23 NOTE — Progress Notes (Signed)
   Subjective:    Patient ID: Jose Roth, male    DOB: 1976-01-25, 41 y.o.   MRN: [id number]  HPI Pt is a 41 yo male who presents to the clinic to follow up on left sided chest wall pain, nausea, family discord, adjustment disorder with anxiety and depression. He was started on prozac with ativan as needed. He is doing much better. His chest pain has resolved. He denies any nausea and vomiting. His full work up of GI , cardio, respiratory were all negative.     Review of Systems  All other systems reviewed and are negative.      Objective:   Physical Exam  Constitutional: He is oriented to person, place, and time. He appears well-developed and well-nourished.  HENT:  Head: Normocephalic and atraumatic.  Cardiovascular: Normal rate, regular rhythm and normal heart sounds.   Pulmonary/Chest: Effort normal and breath sounds normal.  Neurological: He is alert and oriented to person, place, and time.  Psychiatric: He has a normal mood and affect. His behavior is normal.          Assessment & Plan:   Marland KitchenMarland KitchenDiagnoses and all orders for this visit:  Adjustment disorder with mixed anxiety and depressed mood -     FLUoxetine (PROZAC) 40 MG capsule; Take 1 capsule (40 mg total) by mouth daily.  Family discord -     FLUoxetine (PROZAC) 40 MG capsule; Take 1 capsule (40 mg total) by mouth daily.  Adjustment insomnia -     traZODone (DESYREL) 50 MG tablet; Take 1 tablet (50 mg total) by mouth at bedtime as needed for sleep. -     FLUoxetine (PROZAC) 40 MG capsule; Take 1 capsule (40 mg total) by mouth daily.   .. Depression screen [facility] 2/9 11/23/2016 11/06/2016  Decreased Interest 0 1  Down, Depressed, Hopeless 0 3  PHQ - 2 Score 0 4  Altered sleeping 0 2  Tired, decreased energy 0 3  Change in appetite 0 2  Feeling bad or failure about yourself  0 2  Trouble concentrating 0 1  Moving slowly or fidgety/restless 0 1  Suicidal thoughts 0 0  PHQ-9 Score 0 15   .[name] GAD 7 :  Generalized Anxiety Score 11/23/2016  Nervous, Anxious, on Edge 1  Control/stop worrying 1  Worry too much - different things 2  Trouble relaxing 1  Restless 2  Easily annoyed or irritable 1  Afraid - awful might happen 0  Total GAD 7 Score 8    Increased prozac to . Continue trazodone.  Lorazepam as needed.  Pt has started coping better and now has some visitation with children.  Consider counseling to help with day to day stress.

## 2016-11-23 NOTE — Patient Instructions (Signed)
Consider counseling.  [facility] in [place].

## 2017-01-01 ENCOUNTER — Telehealth: Payer: Self-pay

## 2017-01-01 NOTE — Telephone Encounter (Signed)
I will let her know.  She said that she has been talking to him this morning, and he told her he took 3 Saturday, then flushed the rest.  He said he just wanted to sleep.  She also said that he is almost out of his prozac and he will need a refill even though he doesn't feel that it is doing any good.  She asked if they were inpatient or outpatient because she said that he would not go if they were going to admit him because of work.

## 2017-01-01 NOTE — Telephone Encounter (Signed)
My suggestion is that he seems suicidial since he took 6312 klonapin. I would consider taking to  health emergency room or consider [facility] in [place] for a pysch evaluation. It takes days to get in with referall alone to outpatient and I think he needs to see someone today. [facility] does pysch evaluation and can get him seen ASAP.

## 2017-01-01 NOTE — Telephone Encounter (Signed)
Can he come in tomorrow. He was doing really well as of last visit. We may need to consider increasing prozac dose.

## 2017-01-01 NOTE — Telephone Encounter (Signed)
Spoke with wife, will schedule tomorrow.

## 2017-01-01 NOTE — Telephone Encounter (Signed)
Wife called and said that husband was very depressed Saturday, didn't want to do anything, and no appetite.  Wife stated that he took 4212 of her diazepam out of her pill bottle, and she thinks he actually took several but not sure how many, and that he has the other stashed somewhere.  She is asking if you would like to see him or refer him somewhere else.  Please advise

## 2017-01-02 ENCOUNTER — Encounter: Payer: Self-pay | Admitting: Physician Assistant

## 2017-01-02 ENCOUNTER — Ambulatory Visit (INDEPENDENT_AMBULATORY_CARE_PROVIDER_SITE_OTHER): Payer: Self-pay | Admitting: Physician Assistant

## 2017-01-02 VITALS — BP 102/69 | HR 97 | Ht 69.0 in | Wt 163.0 lb

## 2017-01-02 DIAGNOSIS — F411 Generalized anxiety disorder: Secondary | ICD-10-CM

## 2017-01-02 DIAGNOSIS — F329 Major depressive disorder, single episode, unspecified: Secondary | ICD-10-CM | POA: Insufficient documentation

## 2017-01-02 DIAGNOSIS — F332 Major depressive disorder, recurrent severe without psychotic features: Secondary | ICD-10-CM

## 2017-01-02 MED ORDER — ARIPIPRAZOLE 2 MG PO TABS: 2.0000 mg | ORAL_TABLET | Freq: Every day | ORAL | 1 refills | Status: AC

## 2017-01-02 MED ORDER — ARIPIPRAZOLE 2 MG PO TABS: 2.0000 mg | ORAL_TABLET | Freq: Every day | ORAL | 1 refills | Status: DC

## 2017-01-02 NOTE — Progress Notes (Signed)
Subjective:    Patient ID: Jose Roth, male    DOB: 1976/03/20, 41 y.o.   MRN: [id number]  HPI  Pt is a 41 yo male who presents to the clinic with his wife. Over the weekend his depression suddenly worsened and he stole his wifes ativan and took his ativan with trazodone on Saturday night because "he wanted to sleep". He denies that it was any plan to harm himself. He does think at times he would be better off dead but has never made a plan. He denies any substance abuse with alcohol, marijuana, illegal drugs, or opiates. He has had anything bad happen but it is anniversary of his fathers dealth 4 years ago. On Sunday he felt weirdly good "he was happy and singing" but then on Monday he continues to feel more and more depressed. originally after being put on prozac he felt great and did not complain of depression or anxiety.   .. Active Ambulatory Problems    Diagnosis Date Noted  . Hypertriglyceridemia 07/11/2012  . Elevated liver enzymes (Jan 2014) 08/15/2012  . Microscopic hematuria 08/18/2012  . Abdominal pain 08/18/2012  . Nausea & vomiting 08/29/2012  . Diarrhea 08/29/2012  . Substance abuse 04/21/2014  . Family discord 09/05/2016  . Adjustment disorder with mixed anxiety and depressed mood 09/05/2016  . Left hip pain 09/05/2016  . 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 MDD (major depressive disorder) 01/02/2017  . GAD (generalized anxiety disorder) 01/02/2017   Resolved Ambulatory Problems    Diagnosis Date Noted  . No Resolved Ambulatory Problems   Past Medical History:  Diagnosis Date  . Gastritis   . High triglycerides   . Hypertriglyceridemia 07/11/2012       Review of Systems See HPI.     Objective:   Physical Exam  Constitutional: He is oriented to person, place, and time. He appears well-developed and well-nourished.  HENT:  Head: Normocephalic and atraumatic.  Cardiovascular: Normal rate.   Pulmonary/Chest: Effort normal and breath sounds normal.   Neurological: He is alert and oriented to person, place, and time.  Psychiatric: He has a normal mood and affect. His behavior is normal.          Assessment & Plan:  [name].[name].Jose Roth was seen today for depression and anxiety.  Diagnoses and all orders for this visit:  Severe episode of recurrent major depressive disorder, without psychotic features (HCC) -     ARIPiprazole (ABILIFY) 2 MG tablet; Take 1 tablet (2 mg total) by mouth daily.  GAD (generalized anxiety disorder) -     ARIPiprazole (ABILIFY) 2 MG tablet; Take 1 tablet (2 mg total) by mouth daily.  Other orders -     Discontinue: ARIPiprazole (ABILIFY) 2 MG tablet; Take 1 tablet (2 mg total) by mouth daily.      .. [facility] 01/02/2017 11/23/2016 11/06/2016  Decreased Interest 3 0 1  Down, Depressed, Hopeless 2 0 3  PHQ - 2 Score 5 0 4  Altered sleeping 0 0 2  Tired, decreased energy 3 0 3  Change in appetite 2 0 2  Feeling bad or failure about yourself  1 0 2  Trouble concentrating 1 0 1  Moving slowly or fidgety/restless 1 0 1  Suicidal thoughts 1 0 0  PHQ-9 Score 14 0 15   .[name]. GAD 7 : Generalized Anxiety Score 01/02/2017 11/23/2016  Nervous, Anxious, on Edge 3 1  Control/stop worrying 3 1  Worry too much - different things 3  2  Trouble relaxing 3 1  Restless 3 2  Easily annoyed or irritable 3 1  Afraid - awful might happen 3 0  Total GAD 7 Score 21 8  Anxiety Difficulty Very difficult -    Unsure if this is MDD or component of a mood disorder such as bipolar. Will add abilify. Discussed side effects. Follow up in 4 weeks. Consider pysch referral if symptoms not improving or worsening. I think patient would benefit from evaluation. Start counseling in June. Discussed what to do if having suicidal thoughts. Encouraged to go to [employer] [facility] [facility] as needed.   Spent 30 minutes with patient and greater than 50 percent of visit spent counseling regarding treatment plan.

## 2017-01-10 ENCOUNTER — Telehealth: Payer: Self-pay

## 2017-01-10 NOTE — Telephone Encounter (Signed)
Wife called reporting that her husband has taken abiligy for the past 2 nights.  She stated since taking this morning she noticed that he seemed to be spaced out like a zombie.  She received a call from his work.  They also reports to her that he wasn't acting his norm.  Should he continue to take this medication. Please advise. -EH/RMA

## 2017-01-10 NOTE — Telephone Encounter (Signed)
Can often be sedating the first week that you start the medication. Can cut the tablet in half and decrease down to 1 mg a day for for 5 days and then try going up to 2 mg again to see if tolerates a little bit better. Other option would be to stick with the 2 mg at least through the weekend and see if he starts to feel more like himself.

## 2017-01-11 NOTE — Telephone Encounter (Signed)
Patient has stopped the medication all together because it is making him to groggy at work.

## 2017-01-11 NOTE — Telephone Encounter (Signed)
Left message advising of recommendations.  

## 2017-01-11 NOTE — Telephone Encounter (Signed)
Would he like to consider increasing prozac to 60mg  and see if that helps since stopped abilify?

## 2017-01-17 ENCOUNTER — Ambulatory Visit (HOSPITAL_COMMUNITY): Payer: BLUE CROSS/BLUE SHIELD | Admitting: Licensed Clinical Social Worker

## 2017-02-18 ENCOUNTER — Encounter (HOSPITAL_COMMUNITY): Payer: Self-pay | Admitting: Licensed Clinical Social Worker

## 2017-02-22 ENCOUNTER — Ambulatory Visit (HOSPITAL_COMMUNITY): Payer: BLUE CROSS/BLUE SHIELD | Admitting: Licensed Clinical Social Worker

## 2018-08-25 ENCOUNTER — Encounter (HOSPITAL_BASED_OUTPATIENT_CLINIC_OR_DEPARTMENT_OTHER): Payer: Self-pay | Admitting: Emergency Medicine

## 2018-08-25 ENCOUNTER — Emergency Department (HOSPITAL_BASED_OUTPATIENT_CLINIC_OR_DEPARTMENT_OTHER)
Admission: EM | Admit: 2018-08-25 | Discharge: 2018-08-25 | Disposition: A | Discharge status: Home / Self Care | Payer: Self-pay | Attending: Emergency Medicine | Admitting: Emergency Medicine

## 2018-08-25 ENCOUNTER — Other Ambulatory Visit: Payer: Self-pay

## 2018-08-25 ENCOUNTER — Emergency Department (HOSPITAL_BASED_OUTPATIENT_CLINIC_OR_DEPARTMENT_OTHER): Payer: Self-pay

## 2018-08-25 DIAGNOSIS — Y929 Unspecified place or not applicable: Secondary | ICD-10-CM | POA: Insufficient documentation

## 2018-08-25 DIAGNOSIS — Y939 Activity, unspecified: Secondary | ICD-10-CM | POA: Insufficient documentation

## 2018-08-25 DIAGNOSIS — Z79899 Other long term (current) drug therapy: Secondary | ICD-10-CM | POA: Insufficient documentation

## 2018-08-25 DIAGNOSIS — S61412A Laceration without foreign body of left hand, initial encounter: Secondary | ICD-10-CM | POA: Insufficient documentation

## 2018-08-25 DIAGNOSIS — F17210 Nicotine dependence, cigarettes, uncomplicated: Secondary | ICD-10-CM | POA: Insufficient documentation

## 2018-08-25 DIAGNOSIS — W260XXA Contact with knife, initial encounter: Secondary | ICD-10-CM | POA: Insufficient documentation

## 2018-08-25 DIAGNOSIS — Y999 Unspecified external cause status: Secondary | ICD-10-CM | POA: Insufficient documentation

## 2018-08-25 MED ORDER — LIDOCAINE HCL (PF) 1 % IJ SOLN
INTRAMUSCULAR | Status: AC
  Filled 2018-08-25: qty 5

## 2018-08-25 NOTE — ED Provider Notes (Signed)
[facility] EMERGENCY DEPARTMENT Provider Note   CSN: [id number] Arrival date & time: 08/25/18  1743     History   Chief Complaint Chief Complaint  Patient presents with  . Extremity Laceration    HPI Jose Roth is a 43 y.o. male.  HPI   43 year old male presents with laceration to his left hand.  Patient states he was using a utility knife when it slipped and cut in between his thumb and pointer finger.  He denies any decreased range of motion, numbness, tingling.  He denies any difficulty moving his fingers.  Denies any other injuries at this time.   Past Medical History:  Diagnosis Date  . Gastritis   . High triglycerides   . Hypertriglyceridemia 07/11/2012    Patient Active Problem List   Diagnosis Date Noted  . MDD (major depressive disorder) 01/02/2017  . GAD (generalized anxiety disorder) 01/02/2017  . 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 Family discord 09/05/2016  . Adjustment disorder with mixed anxiety and depressed mood 09/05/2016  . Left hip pain 09/05/2016  . Substance abuse (HCC) 04/21/2014  . Nausea & vomiting 08/29/2012  . Diarrhea 08/29/2012  . Microscopic hematuria 08/18/2012  . Abdominal pain 08/18/2012  . Elevated liver enzymes (Jan 2014) 08/15/2012  . Hypertriglyceridemia 07/11/2012    Past Surgical History:  Procedure Laterality Date  . cyst removed  2013   from back of head  . ESOPHAGOGASTRODUODENOSCOPY  09/04/2012   Procedure: ESOPHAGOGASTRODUODENOSCOPY (EGD);  Surgeon: Beverley FiedlerJay M Pyrtle, MD;  [facility];  Service: Gastroenterology;  Laterality: N/A;  . TONSILLECTOMY          Home Medications    Prior to Admission medications   Medication Sig Start Date End Date Taking? Authorizing Provider  ARIPiprazole (ABILIFY) 2 MG tablet Take 1 tablet (2 mg total) by mouth daily. 01/02/17   Breeback, Jade L, PA-C  FLUoxetine (PROZAC) 40 MG capsule Take 1 capsule (40 mg total) by mouth daily. 11/23/16   Breeback, Jade L,  PA-C  LORazepam (ATIVAN) 0.5 MG tablet Take 1 tablet (0.5 mg total) by mouth every 8 (eight) hours as needed for anxiety. 11/06/16   Breeback, Lonna CobbJade L, PA-C  omeprazole (PRILOSEC) 40 MG capsule Take 1 capsule (40 mg total) by mouth daily. 11/06/16   Breeback, Jade L, PA-C  traZODone (DESYREL) 50 MG tablet Take 1 tablet (50 mg total) by mouth at bedtime as needed for sleep. 11/23/16   Jomarie LongsBreeback, Jade L, PA-C    Family History Family History  Problem Relation Age of Onset  . Rheum arthritis Mother   . COPD Father   . Colon cancer Neg Hx   . Colon polyps Father     Social History Social History   Tobacco Use  . Smoking status: Current Every Day Smoker    Packs/day: 0.50    Years: 16.00    Pack years: 8.00    Types: Cigarettes  . Smokeless tobacco: Never Used  . Tobacco comment: form given 08-19-12.  Substance Use Topics  . Alcohol use: Yes    Comment: social   . Drug use: No     Allergies   Lyrica [pregabalin]; Morphine and related; and Tramadol   Review of Systems Review of Systems  Constitutional: Negative for chills and fever.  Respiratory: Negative for shortness of breath.   Cardiovascular: Negative for chest pain.  Gastrointestinal: Negative for abdominal pain, nausea and vomiting.  Musculoskeletal: Negative for arthralgias and myalgias.  Skin: Positive for wound (left hand).  Physical Exam Updated Vital Signs BP 137/90   Pulse (!) 114   Temp 97.8 F (36.6 C) (Oral)   Resp 20   Ht 5\' 9"  (1.753 m)   Wt 74.8 kg   SpO2 97%   BMI 24.37 kg/m   Physical Exam Vitals signs and nursing note reviewed.  Constitutional:      Appearance: He is well-developed.  HENT:     Head: Normocephalic and atraumatic.  Eyes:     Conjunctiva/sclera: Conjunctivae normal.  Neck:     Musculoskeletal: Neck supple.  Cardiovascular:     Rate and Rhythm: Normal rate and regular rhythm.     Heart sounds: Normal heart sounds. No murmur.  Pulmonary:     Effort: Pulmonary effort is  normal. No respiratory distress.     Breath sounds: Normal breath sounds. No wheezing or rales.  Abdominal:     General: Bowel sounds are normal. There is no distension.     Palpations: Abdomen is soft.     Tenderness: There is no abdominal tenderness.  Musculoskeletal: Normal range of motion.        General: No tenderness or deformity.  Skin:    General: Skin is warm and dry.     Findings: Wound (between left thumb and pointer finger, approx 2cm) present. No erythema or rash.  Neurological:     Mental Status: He is alert and oriented to person, place, and time.  Psychiatric:        Behavior: Behavior normal.      ED Treatments / Results  Labs (all labs ordered are listed, but only abnormal results are displayed) Labs Reviewed - No data to display  EKG None  Radiology Dg Hand Complete Left  Result Date: 08/25/2018 CLINICAL DATA:  Laceration between the first and second fingers, initial encounter EXAM: LEFT HAND - COMPLETE 3+ VIEW COMPARISON:  None. FINDINGS: Soft tissue defect is noted consistent with the given clinical history. No radiopaque foreign body is noted. No acute fracture or dislocation is seen. IMPRESSION: Soft tissue injury without acute bony abnormality or foreign body. Electronically Signed   By: Alcide CleverMark  Lukens M.D.   On: 08/25/2018 18:32    Procedures .[name].Laceration Repair Date/Time: 08/25/2018 8:31 PM Performed by: Clayborne ArtistKendrick, Silus Lanzo S, PA-C Authorized by: Clayborne ArtistKendrick, Safaa Stingley S, PA-C   Consent:    Consent obtained:  Verbal   Consent given by:  Patient   Risks discussed:  Infection, need for additional repair, pain, poor cosmetic result and poor wound healing   Alternatives discussed:  No treatment and delayed treatment Universal protocol:    Procedure explained and questions answered to patient or proxy's satisfaction: yes     Relevant documents present and verified: yes     Test results available and properly labeled: yes     Imaging studies available: yes       Required blood products, implants, devices, and special equipment available: yes     Site/side marked: yes     Immediately prior to procedure, a time out was called: yes     Patient identity confirmed:  Verbally with patient Anesthesia (see MAR for exact dosages):    Anesthesia method:  Local infiltration   Local anesthetic:  Lidocaine 1% w/o epi Laceration details:    Location:  Hand   Hand location:  L hand, dorsum   Length (cm):  2   Depth (mm):  5 Repair type:    Repair type:  Simple Pre-procedure details:    Preparation:  Imaging  obtained to evaluate for foreign bodies Exploration:    Hemostasis achieved with:  Direct pressure   Wound exploration: wound explored through full range of motion and entire depth of wound probed and visualized     Wound extent: no nerve damage noted, no tendon damage noted and no vascular damage noted     Contaminated: no   Treatment:    Area cleansed with:  Betadine   Amount of cleaning:  Standard   Irrigation solution:  Sterile saline   Irrigation volume:  100   Irrigation method:  Pressure wash   Visualized foreign bodies/material removed: no   Skin repair:    Repair method:  Sutures   Suture size:  4-0   Suture material:  Prolene   Suture technique:  Simple interrupted   Number of sutures:  3 Approximation:    Approximation:  Close Post-procedure details:    Dressing:  Sterile dressing   (including critical care time)  Medications Ordered in ED Medications  lidocaine (PF) (XYLOCAINE) 1 % injection (has no administration in time range)     Initial Impression / Assessment and Plan / ED Course  I have reviewed the triage vital signs and the nursing notes.  Pertinent labs & imaging results that were available during my care of the patient were reviewed by me and considered in my medical decision making (see chart for details).     Patient presented with hand laceration.  X-ray shows no foreign body or bony abnormality.  He had  a 2 cm laceration noted between the left thumb and pointer finger, bleeding controlled.  His tetanus was last updated 3 years ago.  Wound was irrigated and sutured closed.  He had full range of motion of fingers, no evidence of tendon or ligament injury.  Given strict return precautions.  Encourage close follow-up.  Patient resting comfortably in bed, no acute distress, nontoxic, non-lethargic.  He is ready and stable for discharge.   At this time there does not appear to be any evidence of an acute emergency medical condition and the patient appears stable for discharge with appropriate outpatient follow up.Diagnosis was discussed with patient who verbalizes understanding and is agreeable to discharge.   Final Clinical Impressions(s) / ED Diagnoses   Final diagnoses:  None    ED Discharge Orders    None       Rueben Bash 09/09/18 1259    Terrilee Files, MD 09/10/18 [phone]

## 2018-08-25 NOTE — ED Triage Notes (Signed)
Reports laceration to left hand while using utility knife.  Reports last tetanus was 3 years ago.

## 2018-08-25 NOTE — Discharge Instructions (Signed)
Clean wound with water and soap.  Apply antibiotic ointment and a clean dry dressing.  Return to the ED in 10 to 14 days to have sutures removed or sooner if you notice erythema, swelling or any concerns for infection.

## 2019-11-13 ENCOUNTER — Emergency Department (HOSPITAL_BASED_OUTPATIENT_CLINIC_OR_DEPARTMENT_OTHER)
Admission: EM | Admit: 2019-11-13 | Discharge: 2019-11-13 | Disposition: A | Discharge status: Home / Self Care | Payer: Self-pay | Attending: Emergency Medicine | Admitting: Emergency Medicine

## 2019-11-13 ENCOUNTER — Encounter (HOSPITAL_BASED_OUTPATIENT_CLINIC_OR_DEPARTMENT_OTHER): Payer: Self-pay | Admitting: *Deleted

## 2019-11-13 ENCOUNTER — Other Ambulatory Visit: Payer: Self-pay

## 2019-11-13 DIAGNOSIS — S80871A Other superficial bite, right lower leg, initial encounter: Secondary | ICD-10-CM | POA: Insufficient documentation

## 2019-11-13 DIAGNOSIS — Z23 Encounter for immunization: Secondary | ICD-10-CM | POA: Diagnosis not present

## 2019-11-13 DIAGNOSIS — F17210 Nicotine dependence, cigarettes, uncomplicated: Secondary | ICD-10-CM | POA: Diagnosis not present

## 2019-11-13 DIAGNOSIS — Z885 Allergy status to narcotic agent status: Secondary | ICD-10-CM | POA: Insufficient documentation

## 2019-11-13 DIAGNOSIS — S80872A Other superficial bite, left lower leg, initial encounter: Secondary | ICD-10-CM | POA: Insufficient documentation

## 2019-11-13 DIAGNOSIS — Y9389 Activity, other specified: Secondary | ICD-10-CM | POA: Insufficient documentation

## 2019-11-13 DIAGNOSIS — E781 Pure hyperglyceridemia: Secondary | ICD-10-CM | POA: Diagnosis not present

## 2019-11-13 DIAGNOSIS — Z2914 Encounter for prophylactic rabies immune globin: Secondary | ICD-10-CM | POA: Insufficient documentation

## 2019-11-13 DIAGNOSIS — Z888 Allergy status to other drugs, medicaments and biological substances status: Secondary | ICD-10-CM | POA: Insufficient documentation

## 2019-11-13 DIAGNOSIS — Y990 Civilian activity done for income or pay: Secondary | ICD-10-CM | POA: Diagnosis not present

## 2019-11-13 DIAGNOSIS — Y92014 Private driveway to single-family (private) house as the place of occurrence of the external cause: Secondary | ICD-10-CM | POA: Diagnosis not present

## 2019-11-13 DIAGNOSIS — Z203 Contact with and (suspected) exposure to rabies: Secondary | ICD-10-CM | POA: Insufficient documentation

## 2019-11-13 DIAGNOSIS — W540XXA Bitten by dog, initial encounter: Secondary | ICD-10-CM | POA: Diagnosis not present

## 2019-11-13 MED ORDER — AMOXICILLIN-POT CLAVULANATE 875-125 MG PO TABS: 1.0000 | ORAL_TABLET | Freq: Two times a day (BID) | ORAL | 0 refills | Status: AC

## 2019-11-13 MED ORDER — RABIES VACCINE, PCEC IM SUSR
1.0000 mL | Freq: Once | INTRAMUSCULAR | Status: AC
  Administered 2019-11-13: 1 mL via INTRAMUSCULAR
  Filled 2019-11-13: qty 1

## 2019-11-13 NOTE — Discharge Instructions (Signed)
You were seen in the emergency department today with animal bite.  We are starting the rabies vaccine series.  You should complete the series unless animal control tells you it is okay to stop.  I have listed a letter here which shows the dates of your shots and where you should go to get them.  I am prescribing an antibiotic only to take if your redness in the legs worsens or you develop fever, otherwise you do not need to start this medication.

## 2019-11-13 NOTE — ED Triage Notes (Signed)
Pt c/o dog bite to bil legs x 1 day ago

## 2019-11-13 NOTE — ED Provider Notes (Signed)
Emergency Department Provider Note   I have reviewed the triage vital signs and the nursing notes.   HISTORY  Chief Complaint No chief complaint on file.   HPI Jose Roth is a 44 y.o. male presents to the ED with dog bites to the lower legs. 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 The patient left and went back to work where he filed a work report. He was advised today to present for evaluation of leg wounds and has contacted animal control. Unknown vaccination of the dog. Patient notes superficial scrapes to the bilateral lower legs. No surrounding redness, warmth, or drainage. No radiation of symptoms or modifying factors.    Past Medical History:  Diagnosis Date  . Gastritis   . High triglycerides   . Hypertriglyceridemia 07/11/2012    Patient Active Problem List   Diagnosis Date Noted  . MDD (major depressive disorder) 01/02/2017  . GAD (generalized anxiety disorder) 01/02/2017  . 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 Family discord 09/05/2016  . Adjustment disorder with mixed anxiety and depressed mood 09/05/2016  . Left hip pain 09/05/2016  . Substance abuse ([place]) 04/21/2014  . Nausea & vomiting 08/29/2012  . Diarrhea 08/29/2012  . Microscopic hematuria 08/18/2012  . Abdominal pain 08/18/2012  . Elevated liver enzymes (Jan 2014) 08/15/2012  . Hypertriglyceridemia 07/11/2012    Past Surgical History:  Procedure Laterality Date  . cyst removed  2013   from back of head  . ESOPHAGOGASTRODUODENOSCOPY  09/04/2012   Procedure: ESOPHAGOGASTRODUODENOSCOPY (EGD);  Surgeon: Jerene Bears, MD;  Location: [facility];  Service: Gastroenterology;  Laterality: N/A;  . TONSILLECTOMY      Allergies Lyrica [pregabalin], Morphine and related, and Tramadol  Family History  Problem Relation Age of Onset  . Rheum arthritis Mother   . COPD Father   . Colon polyps Father   . Colon cancer Neg Hx     Social  History Social History   Tobacco Use  . Smoking status: Current Every Day Smoker    Packs/day: 0.50    Years: 16.00    Pack years: 8.00    Types: Cigarettes  . Smokeless tobacco: Never Used  . Tobacco comment: form given 08-19-12.  Substance Use Topics  . Alcohol use: Yes    Comment: social   . Drug use: No    Review of Systems  Constitutional: No fever/chills Musculoskeletal: Negative for back pain. Skin: Bites to the bilateral legs.  Neurological: Negative for headaches.  ____________________________________________   PHYSICAL EXAM:  VITAL SIGNS: ED Triage Vitals  Enc Vitals Group     BP 11/13/19 1813 (!) 128/97     Pulse Rate 11/13/19 1813 79     Resp 11/13/19 1813 18     Temp 11/13/19 1813 98.1 F (36.7 C)     Temp Source 11/13/19 1813 Oral     SpO2 11/13/19 1813 100 %     Weight 11/13/19 1810 174 lb (78.9 kg)     Height 11/13/19 1810 5\' 9"  (1.753 m)   Constitutional: Alert and oriented. Well appearing and in no acute distress. Eyes: Conjunctivae are normal.  Head: Atraumatic. Nose: No congestion/rhinnorhea. Mouth/Throat: Mucous membranes are moist.  Neck: No stridor.  Cardiovascular: Normal rate, regular rhythm.  Respiratory: Normal respiratory effort.  Gastrointestinal: No distention.  Musculoskeletal: No lower extremity tenderness nor edema. No gross deformities of extremities. Neurologic:  Normal speech and language. Skin:  Skin is  warm and dry. Two ares of superficial abrasions with possible small puncture (healing) without warmth or surrounding cellulitis.    ____________________________________________   PROCEDURES  Procedure(s) performed:   Procedures  None ____________________________________________   INITIAL IMPRESSION / ASSESSMENT AND PLAN / ED COURSE  Pertinent labs & imaging results that were available during my care of the patient were reviewed by me and considered in my medical decision making (see chart for details).     Patient presents to the ED for evaluation of dog bite yesterday. Wounds are well appearing and do not appear infected. Will give a watch-and-wait Rx for Augmentin. Rabies vaccination given and series shot information given along with clinic information for getting these shots in the future. Animal Control is involved per patient and vaccination status of dog remains unknown for now. Discussed ED return precautions.    ____________________________________________  FINAL CLINICAL IMPRESSION(S) / ED DIAGNOSES  Final diagnoses:  Dog bite, initial encounter     MEDICATIONS GIVEN DURING THIS VISIT:  Medications  rabies vaccine (RABAVERT) injection 1 mL (1 mL Intramuscular Given 11/13/19 1956)     NEW OUTPATIENT MEDICATIONS STARTED DURING THIS VISIT:  Discharge Medication List as of 11/13/2019  7:51 PM    START taking these medications   Details  amoxicillin-clavulanate (AUGMENTIN) 875-125 MG tablet Take 1 tablet by mouth every 12 (twelve) hours for 7 days., Starting Fri 11/13/2019, Until Fri 11/20/2019, Print        Note:  This document was prepared using Dragon voice recognition software and may include unintentional dictation errors.  Alona Bene, MD, [facility]    Ellyse Rotolo, Arlyss Repress, MD 11/14/19 1028
